# Patient Record
Sex: Female | Born: 1985 | Race: Black or African American | Hispanic: No | Marital: Married | Smoking: Never smoker
Health system: Southern US, Community
[De-identification: ages and names within clinical notes are randomized; demographics above are authoritative.]

## PROBLEM LIST (undated history)

## (undated) DIAGNOSIS — R002 Palpitations: Secondary | ICD-10-CM

## (undated) DIAGNOSIS — T7840XA Allergy, unspecified, initial encounter: Secondary | ICD-10-CM

## (undated) HISTORY — DX: Allergy, unspecified, initial encounter: T78.40XA

## (undated) HISTORY — PX: NO PAST SURGERIES: SHX2092

---

## 2010-08-13 ENCOUNTER — Other Ambulatory Visit (HOSPITAL_COMMUNITY): Payer: Self-pay | Admitting: Obstetrics and Gynecology

## 2010-08-13 DIAGNOSIS — Z0489 Encounter for examination and observation for other specified reasons: Secondary | ICD-10-CM

## 2010-08-13 DIAGNOSIS — R772 Abnormality of alphafetoprotein: Secondary | ICD-10-CM

## 2010-08-20 ENCOUNTER — Ambulatory Visit (HOSPITAL_COMMUNITY)
Admission: RE | Admit: 2010-08-20 | Discharge: 2010-08-20 | Disposition: A | Discharge status: Home / Self Care | Payer: Medicaid Other | Source: Ambulatory Visit | Attending: Obstetrics and Gynecology | Admitting: Obstetrics and Gynecology

## 2010-08-20 DIAGNOSIS — O358XX0 Maternal care for other (suspected) fetal abnormality and damage, not applicable or unspecified: Secondary | ICD-10-CM | POA: Insufficient documentation

## 2010-08-20 DIAGNOSIS — R772 Abnormality of alphafetoprotein: Secondary | ICD-10-CM

## 2010-08-20 DIAGNOSIS — Z1389 Encounter for screening for other disorder: Secondary | ICD-10-CM | POA: Insufficient documentation

## 2010-08-20 DIAGNOSIS — Z363 Encounter for antenatal screening for malformations: Secondary | ICD-10-CM | POA: Insufficient documentation

## 2010-08-20 DIAGNOSIS — Z0489 Encounter for examination and observation for other specified reasons: Secondary | ICD-10-CM

## 2010-08-20 DIAGNOSIS — O352XX0 Maternal care for (suspected) hereditary disease in fetus, not applicable or unspecified: Secondary | ICD-10-CM | POA: Insufficient documentation

## 2010-10-26 ENCOUNTER — Other Ambulatory Visit (HOSPITAL_COMMUNITY): Payer: Self-pay | Admitting: Obstetrics and Gynecology

## 2010-11-02 ENCOUNTER — Ambulatory Visit (HOSPITAL_COMMUNITY)
Admission: RE | Admit: 2010-11-02 | Discharge: 2010-11-02 | Disposition: A | Discharge status: Home / Self Care | Payer: Medicaid Other | Source: Ambulatory Visit | Attending: Obstetrics and Gynecology | Admitting: Obstetrics and Gynecology

## 2010-11-02 ENCOUNTER — Other Ambulatory Visit (HOSPITAL_COMMUNITY): Payer: Self-pay | Admitting: Obstetrics and Gynecology

## 2010-11-02 DIAGNOSIS — O365990 Maternal care for other known or suspected poor fetal growth, unspecified trimester, not applicable or unspecified: Secondary | ICD-10-CM | POA: Insufficient documentation

## 2010-11-02 DIAGNOSIS — O352XX0 Maternal care for (suspected) hereditary disease in fetus, not applicable or unspecified: Secondary | ICD-10-CM | POA: Insufficient documentation

## 2010-11-02 DIAGNOSIS — Z3689 Encounter for other specified antenatal screening: Secondary | ICD-10-CM | POA: Insufficient documentation

## 2010-11-03 ENCOUNTER — Other Ambulatory Visit (HOSPITAL_COMMUNITY): Payer: Self-pay | Admitting: Obstetrics and Gynecology

## 2010-11-03 DIAGNOSIS — R772 Abnormality of alphafetoprotein: Secondary | ICD-10-CM

## 2010-11-04 ENCOUNTER — Other Ambulatory Visit (HOSPITAL_COMMUNITY): Payer: Self-pay | Admitting: Obstetrics and Gynecology

## 2010-11-04 DIAGNOSIS — R772 Abnormality of alphafetoprotein: Secondary | ICD-10-CM

## 2010-11-09 ENCOUNTER — Other Ambulatory Visit (HOSPITAL_COMMUNITY): Payer: Medicaid Other

## 2010-11-11 ENCOUNTER — Ambulatory Visit (HOSPITAL_COMMUNITY)
Admission: RE | Admit: 2010-11-11 | Discharge: 2010-11-11 | Disposition: A | Discharge status: Home / Self Care | Payer: Medicaid Other | Source: Ambulatory Visit | Attending: Obstetrics and Gynecology | Admitting: Obstetrics and Gynecology

## 2010-11-11 DIAGNOSIS — R772 Abnormality of alphafetoprotein: Secondary | ICD-10-CM

## 2010-11-11 DIAGNOSIS — Z3689 Encounter for other specified antenatal screening: Secondary | ICD-10-CM | POA: Insufficient documentation

## 2010-11-11 DIAGNOSIS — O365990 Maternal care for other known or suspected poor fetal growth, unspecified trimester, not applicable or unspecified: Secondary | ICD-10-CM | POA: Insufficient documentation

## 2010-11-11 DIAGNOSIS — O352XX0 Maternal care for (suspected) hereditary disease in fetus, not applicable or unspecified: Secondary | ICD-10-CM | POA: Insufficient documentation

## 2010-11-16 ENCOUNTER — Other Ambulatory Visit (HOSPITAL_COMMUNITY): Payer: Medicaid Other

## 2010-11-18 ENCOUNTER — Other Ambulatory Visit (HOSPITAL_COMMUNITY): Payer: Self-pay | Admitting: Obstetrics and Gynecology

## 2010-11-18 ENCOUNTER — Encounter (HOSPITAL_COMMUNITY): Payer: Self-pay

## 2010-11-18 ENCOUNTER — Ambulatory Visit (HOSPITAL_COMMUNITY)
Admission: RE | Admit: 2010-11-18 | Discharge: 2010-11-18 | Disposition: A | Discharge status: Home / Self Care | Payer: Medicaid Other | Source: Ambulatory Visit | Attending: Obstetrics and Gynecology | Admitting: Obstetrics and Gynecology

## 2010-11-18 DIAGNOSIS — R772 Abnormality of alphafetoprotein: Secondary | ICD-10-CM

## 2010-11-18 DIAGNOSIS — O352XX0 Maternal care for (suspected) hereditary disease in fetus, not applicable or unspecified: Secondary | ICD-10-CM | POA: Insufficient documentation

## 2010-11-18 DIAGNOSIS — Z3689 Encounter for other specified antenatal screening: Secondary | ICD-10-CM | POA: Insufficient documentation

## 2010-11-18 DIAGNOSIS — O365990 Maternal care for other known or suspected poor fetal growth, unspecified trimester, not applicable or unspecified: Secondary | ICD-10-CM | POA: Insufficient documentation

## 2010-11-25 ENCOUNTER — Ambulatory Visit (HOSPITAL_COMMUNITY): Payer: Medicaid Other

## 2010-11-29 ENCOUNTER — Inpatient Hospital Stay (HOSPITAL_COMMUNITY)
Admission: AD | Admit: 2010-11-29 | Discharge: 2010-12-02 | DRG: 775 | Disposition: A | Discharge status: Home / Self Care | Payer: Medicaid Other | Source: Ambulatory Visit | Attending: Obstetrics and Gynecology | Admitting: Obstetrics and Gynecology

## 2010-11-29 LAB — CBC
Hemoglobin: 11.9 g/dL — ABNORMAL LOW (ref 12.0–15.0)
RBC: 3.93 MIL/uL (ref 3.87–5.11)

## 2010-11-30 ENCOUNTER — Other Ambulatory Visit: Payer: Self-pay | Admitting: Obstetrics and Gynecology

## 2010-11-30 LAB — RPR: RPR Ser Ql: NONREACTIVE

## 2010-12-02 ENCOUNTER — Ambulatory Visit (HOSPITAL_COMMUNITY): Admission: RE | Admit: 2010-12-02 | Payer: Medicaid Other | Source: Ambulatory Visit

## 2010-12-02 ENCOUNTER — Ambulatory Visit (HOSPITAL_COMMUNITY): Payer: Medicaid Other

## 2010-12-05 NOTE — Discharge Summary (Signed)
  NAMETARHONDA, HOLLENBERG                ACCOUNT NO.:  [id number]  MEDICAL RECORD NO.:  [id number]  LOCATION:  9111                          FACILITY:  [facility]  PHYSICIAN:  Zenaida Niece, M.D.DATE OF BIRTH:  06-11-1985  DATE OF ADMISSION:  11/29/2010 DATE OF DISCHARGE:  12/02/2010                              DISCHARGE SUMMARY   ADMISSION DIAGNOSIS:  Intrauterine pregnancy at 37 weeks with rupture of membranes.  DISCHARGE DIAGNOSIS:  Intrauterine pregnancy at 37 weeks with rupture of membranes.  PROCEDURES:  On November 30, 2010, she had a spontaneous vaginal delivery.  HISTORY AND PHYSICAL:  This is a 25 year old gravida 1, para 0 with an EGA of 37 plus weeks who presents with complaint of leaking fluid since 1430 on November 29, 2010, with occasional contractions.  Evaluation in triage confirmed ruptured membranes and cervix was 2, 50 and -1 with a vertex presentation per the nurse.  Prenatal care complicated by possible SGA followed with NSTs, biophysical profiles and ultrasounds in consult with maternal fetal medicine.  Prenatal labs blood type is A+ with negative antibody screen, rubella immune, hepatitis B surface antigen negative.  Quad screen with a 1 in 29 risk of trisomy 18, 1-hour Glucola 95, group B strep is negative.  PAST MEDICAL HISTORY:  History of Chlamydia and polydactyly.  PAST SURGICAL HISTORY:  Finger surgery.  PHYSICAL EXAMINATION:  GENERAL:  She is afebrile with stable vital signs.  Fetal heart tracing category II with occasional variable decels and occasional contractions. ABDOMEN:  Gravid, nontender with an estimated fetal weight of 5 pounds. Cervix again is 2, 50, -1 vertex per the nurse.  HOSPITAL COURSE:  The patient was admitted and started on Pitocin augmentation.  She progressed into active labor and received an epidural.  She progressed to complete, pushed well and on the morning of November 30, 2010, had a vaginal delivery of a viable female infant  with Apgars of 9 and 9, weight of 5 pounds 7 ounces.  Placenta delivered spontaneously and was intact.  She had small first-degree laceration repaired with 3-0 Vicryl and several superficial labial abrasions which were hemostatic and not repaired.  Estimated blood loss was less than 500 mL.  Postpartum, she had no significant complications and on postpartum #2, she was felt to be stable enough for discharge home.  DISCHARGE INSTRUCTIONS:  Regular diet, pelvic rest, follow up in 6 weeks.  MEDICATIONS:  Over-the-counter ibuprofen as needed and Percocet #20 one to two p.o. q.6 h p.r.n. pain and she was given our discharge pamphlet.     Zenaida Niece, M.D.     TDM/MEDQ  D:  12/02/2010  T:  12/02/2010  Job:  [id number]  Electronically Signed by Lavina Hamman M.D. on 12/05/2010 09:18:16 AM

## 2010-12-09 ENCOUNTER — Inpatient Hospital Stay (HOSPITAL_COMMUNITY): Admission: AD | Admit: 2010-12-09 | Payer: Self-pay | Source: Ambulatory Visit | Admitting: Obstetrics and Gynecology

## 2012-01-20 ENCOUNTER — Encounter (HOSPITAL_COMMUNITY): Payer: Self-pay | Admitting: *Deleted

## 2012-01-20 ENCOUNTER — Emergency Department (INDEPENDENT_AMBULATORY_CARE_PROVIDER_SITE_OTHER)
Admission: EM | Admit: 2012-01-20 | Discharge: 2012-01-20 | Disposition: A | Discharge status: Home / Self Care | Payer: Self-pay | Source: Home / Self Care

## 2012-01-20 DIAGNOSIS — N750 Cyst of Bartholin's gland: Secondary | ICD-10-CM

## 2012-01-20 LAB — POCT URINALYSIS DIP (DEVICE)
Glucose, UA: NEGATIVE mg/dL
Ketones, ur: 15 mg/dL — AB
Specific Gravity, Urine: >=1.03 (ref 1.005–1.030)

## 2012-01-20 NOTE — ED Notes (Signed)
Pt  Has  A  Tender  Swollen  Area  Inside  Vagina     X  sev  Weeks      She  Reports  Slight   Vaginal  Discharge  denys  Any  Bleeding       Walks  Upright  With a  Slow  Steady   Gait

## 2012-01-20 NOTE — ED Provider Notes (Signed)
History     CSN: [id number]  Arrival date & time 01/20/12  1837   None     Chief Complaint  Patient presents with  . Groin Swelling    (Consider location/radiation/quality/duration/timing/severity/associated sxs/prior treatment) The history is provided by the patient.  Patient complains of left vaginal "bump" for two weeks associated with mild discomfort.  States discomfort is intermittent in nature; No fever, + nonirritating vaginal discharge, -abdominal pain, -vomiting, -diarrhea, -UTI symptoms.  Sexually active, denies hx of STD's, does not use condoms, no change in partners.  Last unprotected intercourse 2 days ago.  Denies history of known exposure to STD or symptoms in partner.  Patient's last menstrual period was 12/08/2011.  Last pap 1 year ago-normal.   History reviewed. No pertinent past medical history.  History reviewed. No pertinent past surgical history.  History reviewed. No pertinent family history.  History  Substance Use Topics  . Smoking status: Never Smoker   . Smokeless tobacco: Not on file  . Alcohol Use:     OB History    Grav Para Term Preterm Abortions TAB SAB Ect Mult Living   1               Review of Systems  Constitutional: Negative.   Respiratory: Negative.   Cardiovascular: Negative.   Gastrointestinal: Negative.   Genitourinary: Positive for vaginal discharge and vaginal pain. Negative for dysuria, urgency, frequency, hematuria, flank pain, difficulty urinating, genital sores, menstrual problem, pelvic pain and dyspareunia.  Skin: Negative.   Hematological: Negative for adenopathy.    Allergies  Review of patient's allergies indicates no known allergies.  Home Medications  No current outpatient prescriptions on file.  BP 105/65  Pulse 60  Temp 98.3 F (36.8 C) (Oral)  Resp 16  SpO2 98%  LMP 12/08/2011  Breastfeeding? Unknown  Physical Exam  Nursing note and vitals reviewed. Constitutional: She is oriented to person,  place, and time. Vital signs are normal. She appears well-developed and well-nourished. She is active and cooperative.  HENT:  Head: Normocephalic.  Eyes: Conjunctivae are normal. Pupils are equal, round, and reactive to light. No scleral icterus.  Neck: Trachea normal. Neck supple.  Cardiovascular: Normal rate, regular rhythm and normal heart sounds.   Pulmonary/Chest: Effort normal and breath sounds normal.  Abdominal: Normal appearance and bowel sounds are normal. There is no tenderness.  Genitourinary: Pelvic exam was performed with patient supine. No labial fusion. There is no rash, tenderness or lesion on the right labia. There is no rash, tenderness or lesion on the left labia. No erythema, tenderness or bleeding around the vagina. No foreign body around the vagina. No signs of injury around the vagina. Vaginal discharge found.       White discharge; bartholin's cyst left at 4 o'clock, approximately 1-2 cm  Lymphadenopathy:       Right: No inguinal adenopathy present.       Left: No inguinal adenopathy present.  Neurological: She is alert and oriented to person, place, and time. No cranial nerve deficit or sensory deficit.  Skin: Skin is warm and dry.  Psychiatric: She has a normal mood and affect. Her speech is normal and behavior is normal. Judgment and thought content normal. Cognition and memory are normal.    ED Course  Procedures (including critical care time)  Labs Reviewed  POCT URINALYSIS DIP (DEVICE) - Abnormal; Notable for the following:    Ketones, ur 15 (*)     All other components within normal limits  No results found.   1. Bartholin gland cyst       MDM  No abscess appreciated.  Sitz baths twice daily, ibuprofen for discomfort, monitor for changes as discussed.          Johnsie Kindred, NP 01/20/12 2208

## 2012-01-21 NOTE — ED Provider Notes (Signed)
Medical screening examination/treatment/procedure(s) were performed by non-physician practitioner and as supervising physician I was immediately available for consultation/collaboration.   [facility]; MD   Sharin Grave, MD 01/21/12 [phone]

## 2012-02-15 ENCOUNTER — Telehealth (HOSPITAL_COMMUNITY): Payer: Self-pay | Admitting: *Deleted

## 2012-02-15 NOTE — ED Notes (Signed)
Pt. called on VM and said she was seen for a cyst 3 weeks ago.  States it is still there and wants to know how to make it go away.  I called pt. back and she said it is in her vagina, it hurts, is draining with an odor. I asked if she got any antibiotics. She said no. I told her to come back and be rechecked.  Vassie Moselle 02/15/2012

## 2012-10-18 IMAGING — US US OB DETAIL+14 WK
1 series · 14 of 28 positions shown · non-contrast
Comparison: none

[Series 1: us ob detail+14 wk · 0.21mm/px · 14 of 86 slices shown]
[im 4/86]
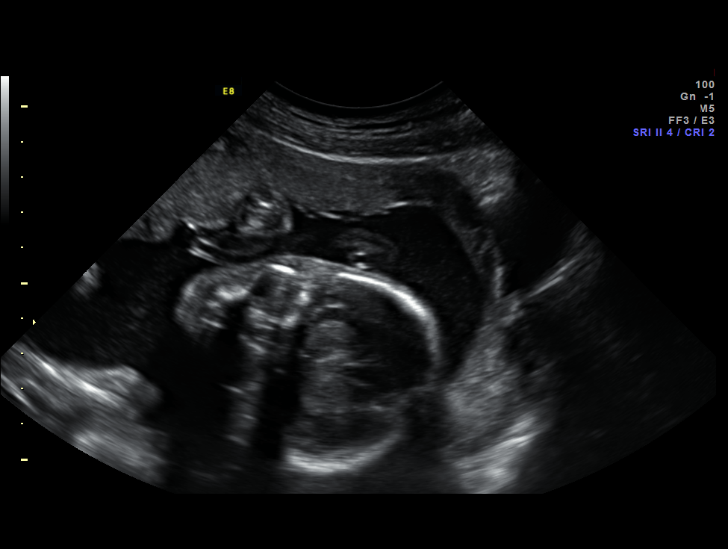
[im 10/86]
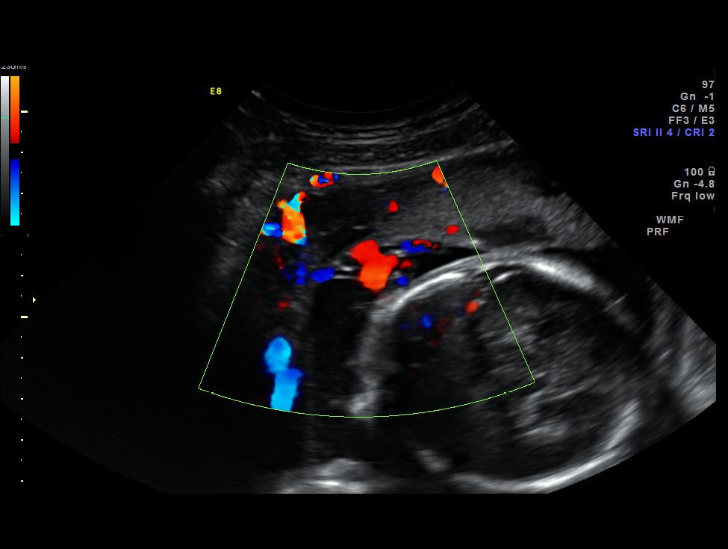
[im 16/86]
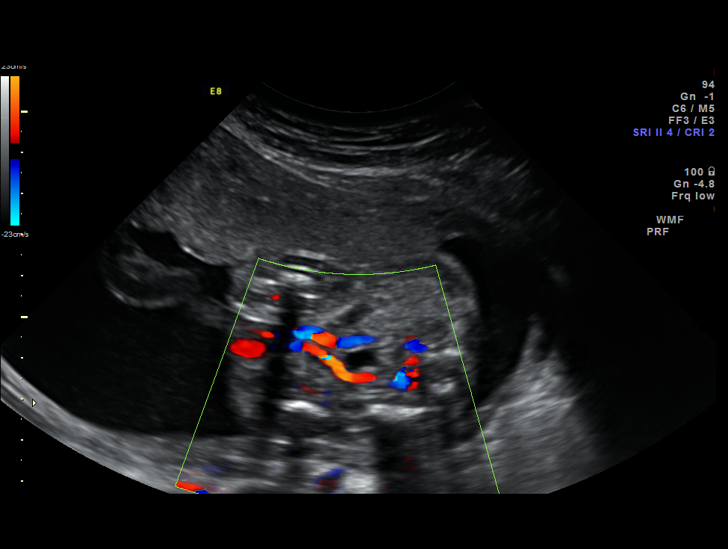
[im 23/86]
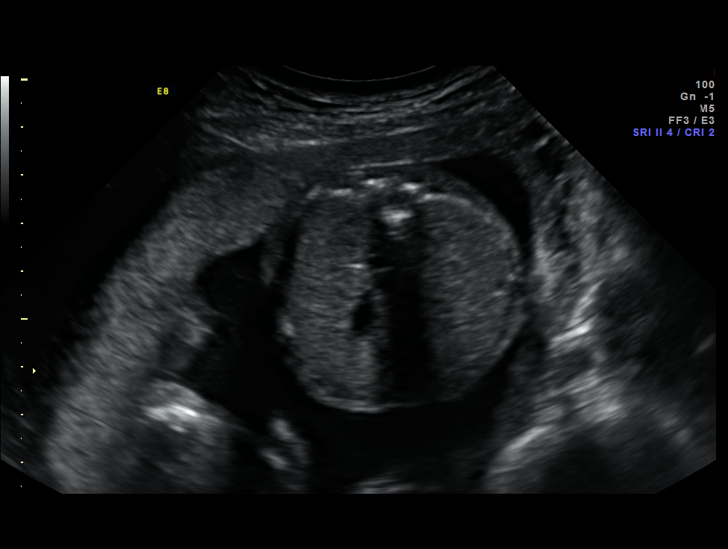
[im 29/86]
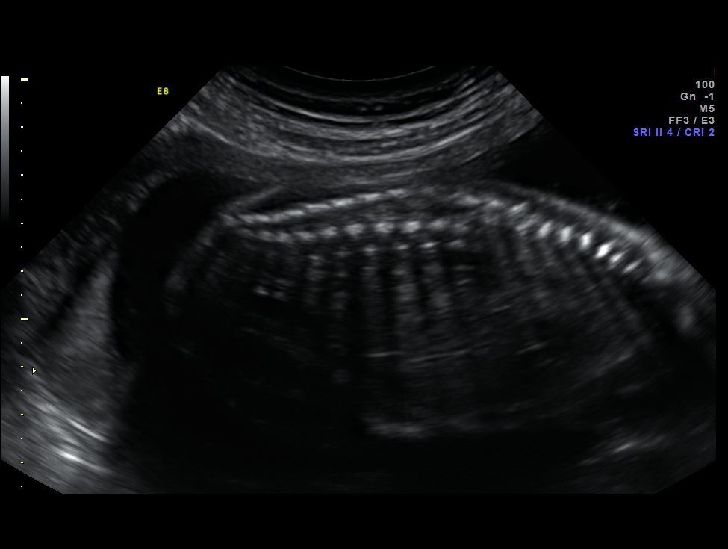
[im 35/86]
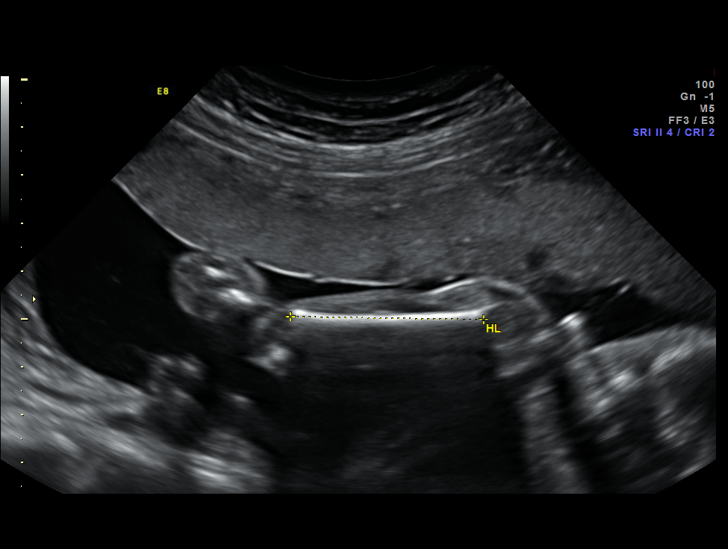
[im 41/86]
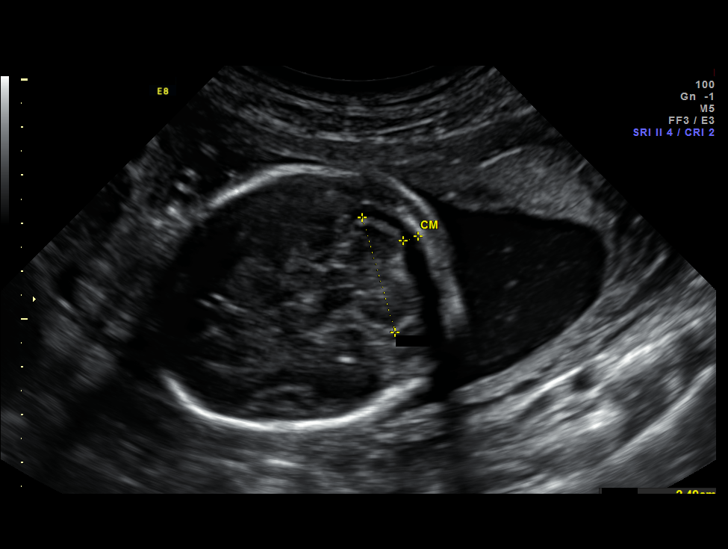
[im 48/86]
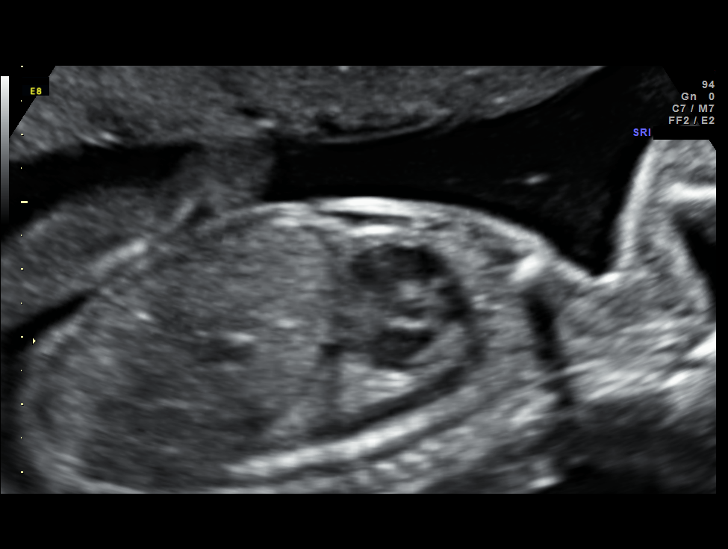
[im 54/86]
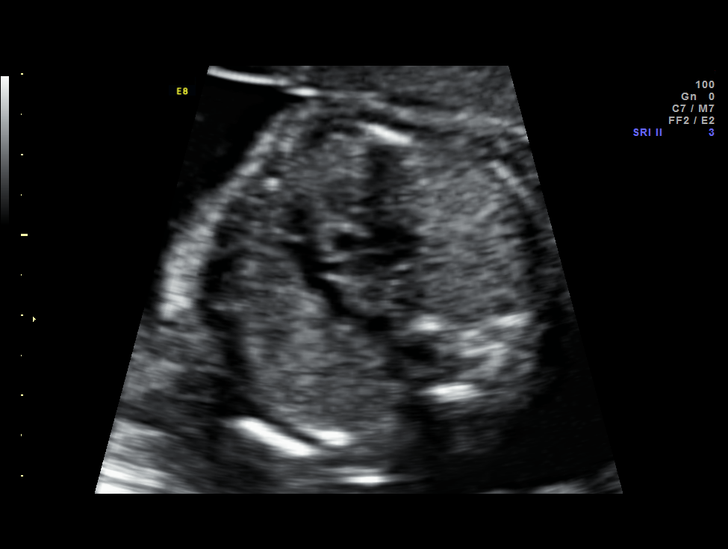
[im 60/86]
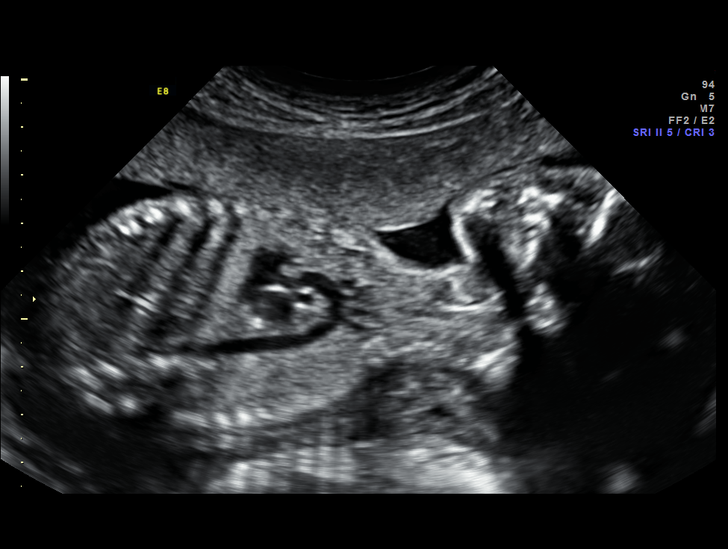
[im 67/86]
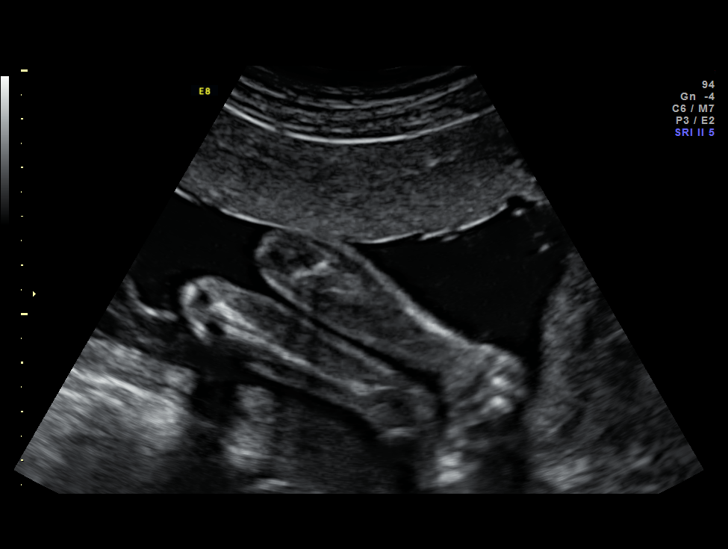
[im 73/86]
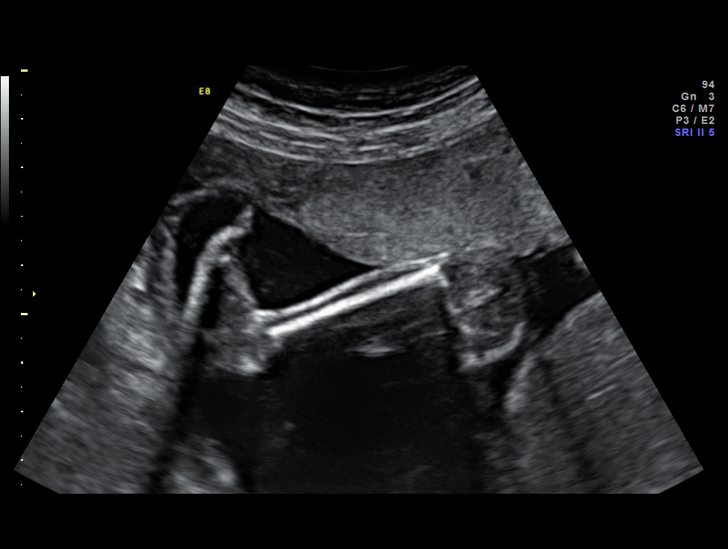
[im 79/86]
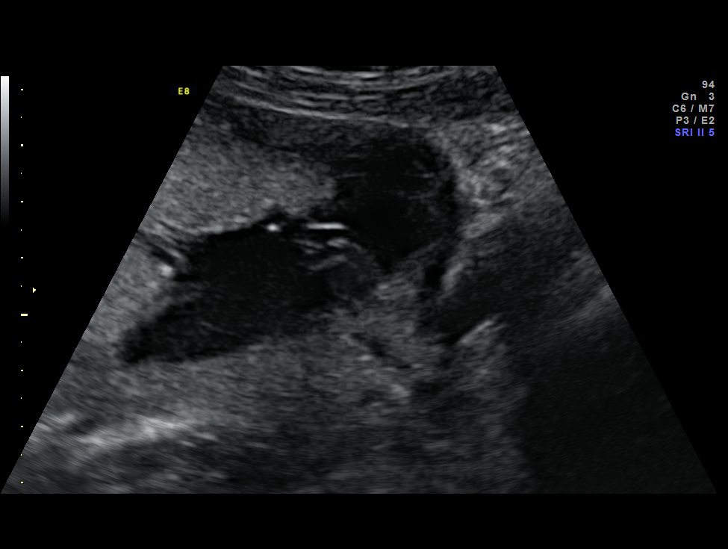
[im 86/86]
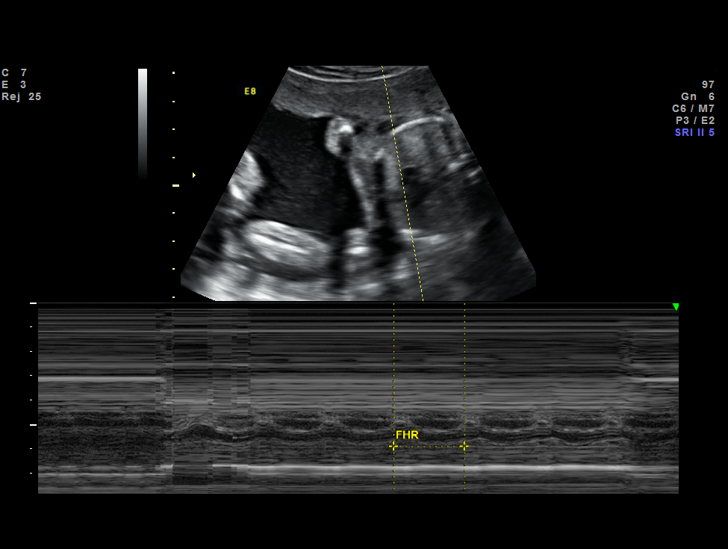

[14 of 28 positions shown; findings below may reference images not displayed]

Canned report from images found in remote index.

Refer to host system for actual result text.

## 2013-01-09 IMAGING — US US FETAL BPP W/O NONSTRESS
1 series · 14 of 21 positions shown · non-contrast
Comparison: none

[Series 1: us fetal bpp w/o nonstress · 0.24mm/px · 14 of 21 slices shown]
[im 1/21]
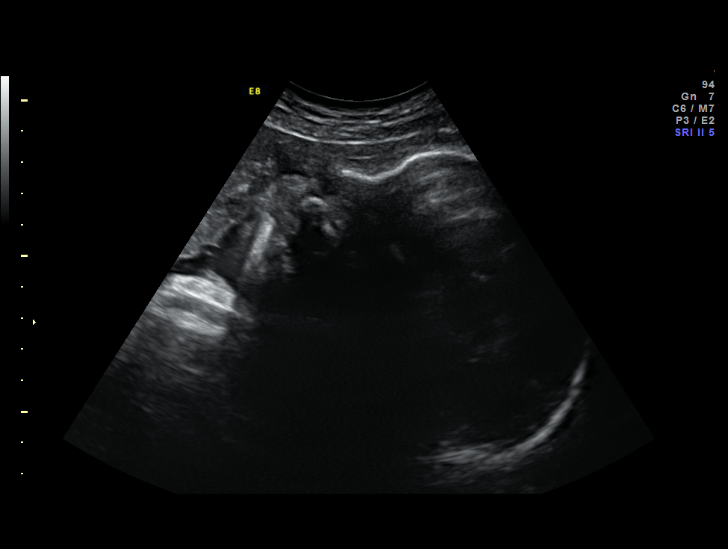
[im 3/21]
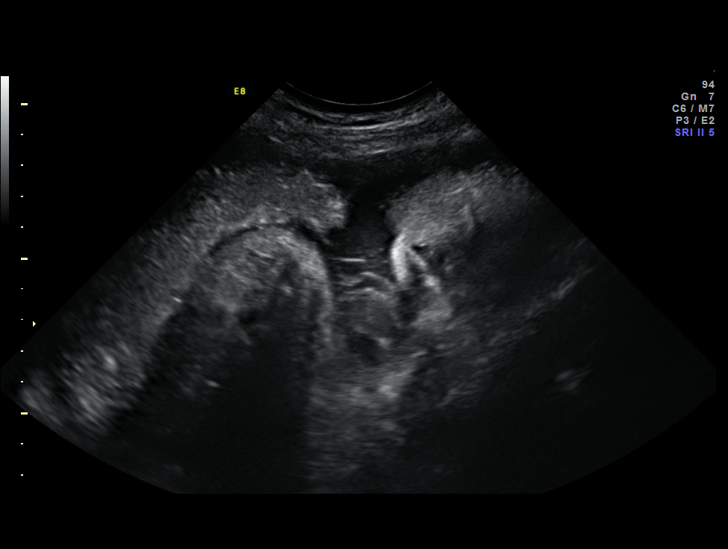
[im 4/21]
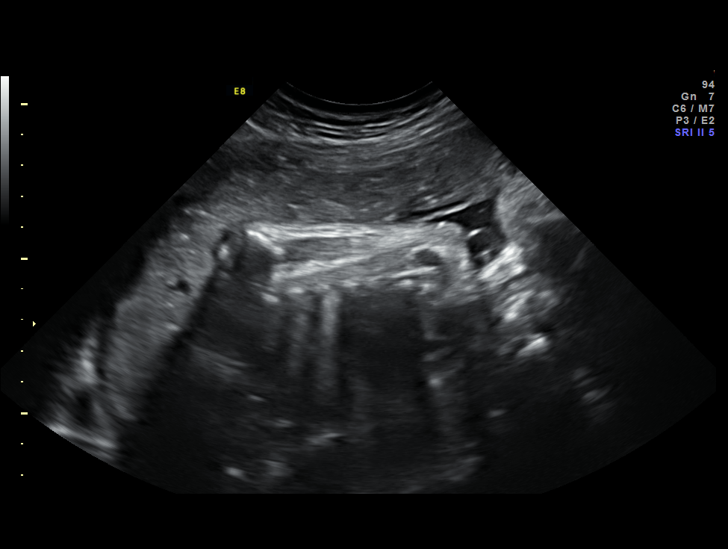
[im 6/21]
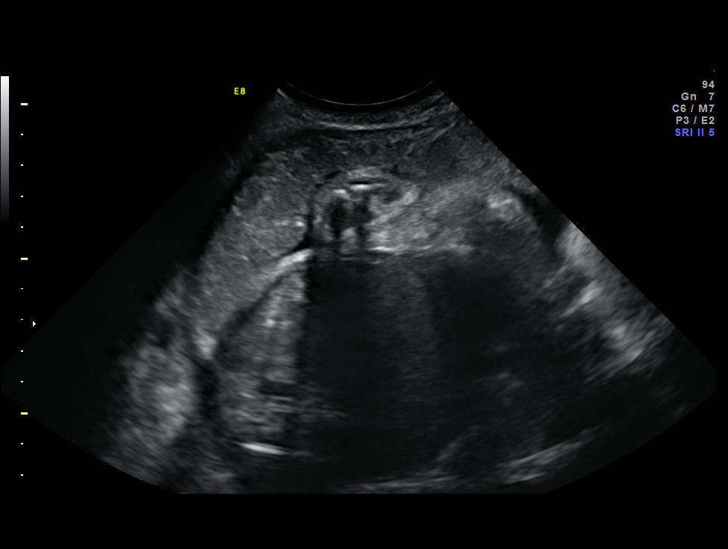
[im 7/21]
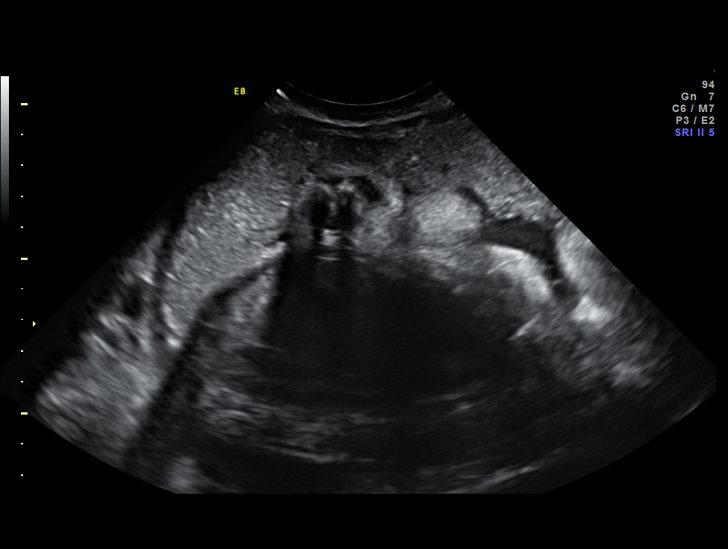
[im 9/21]
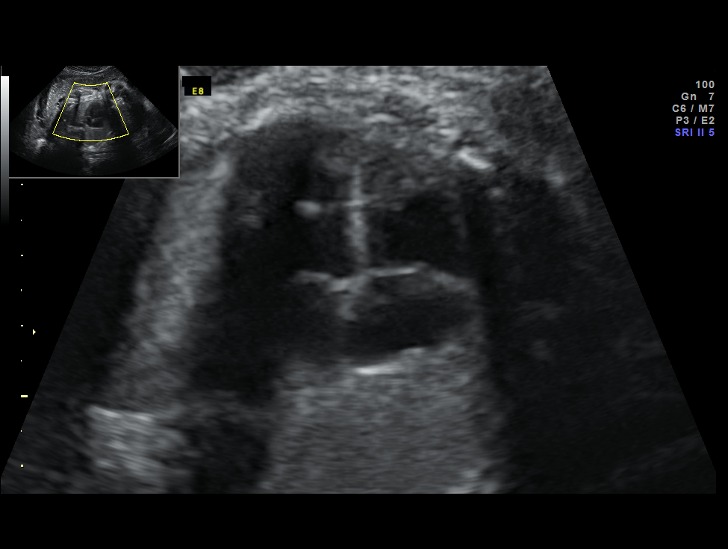
[im 10/21]
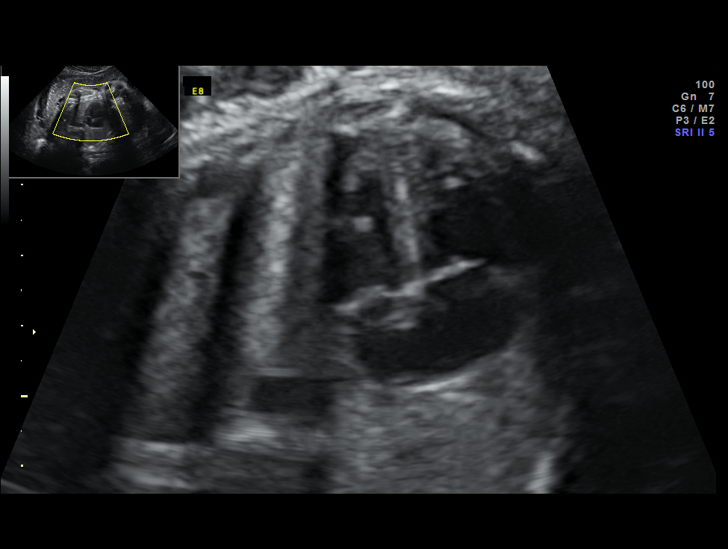
[im 12/21]
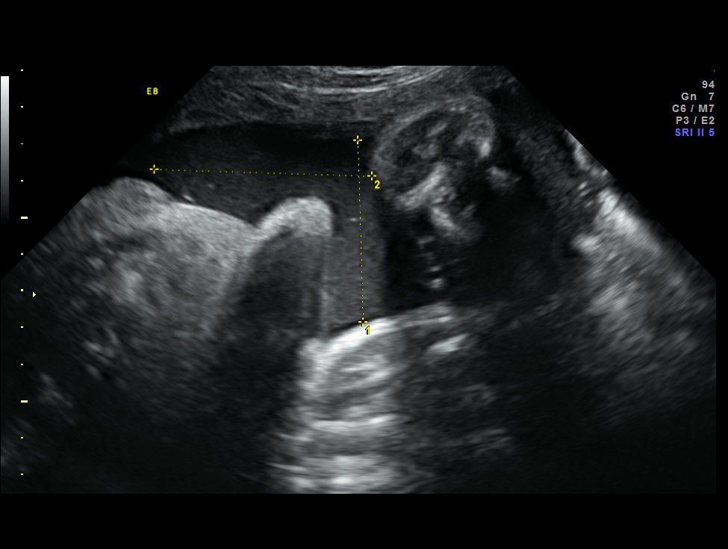
[im 13/21]
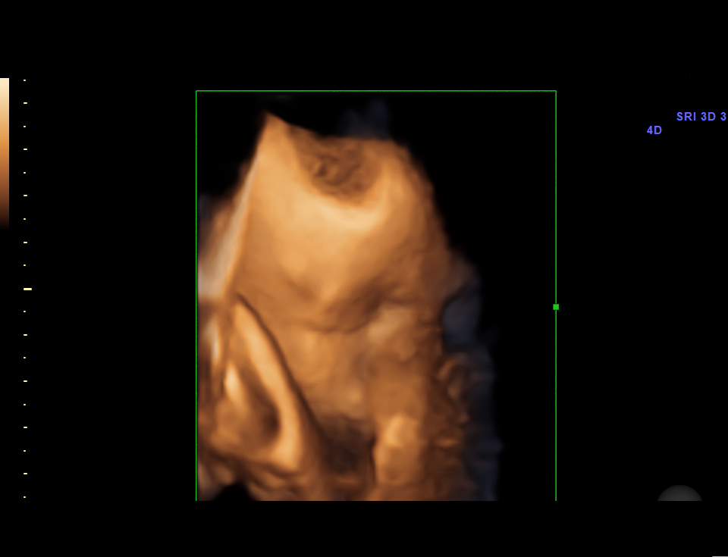
[im 15/21]
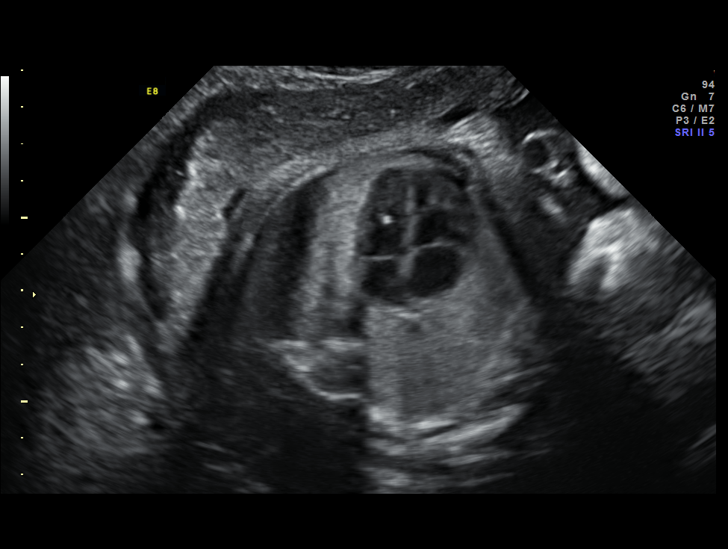
[im 16/21]
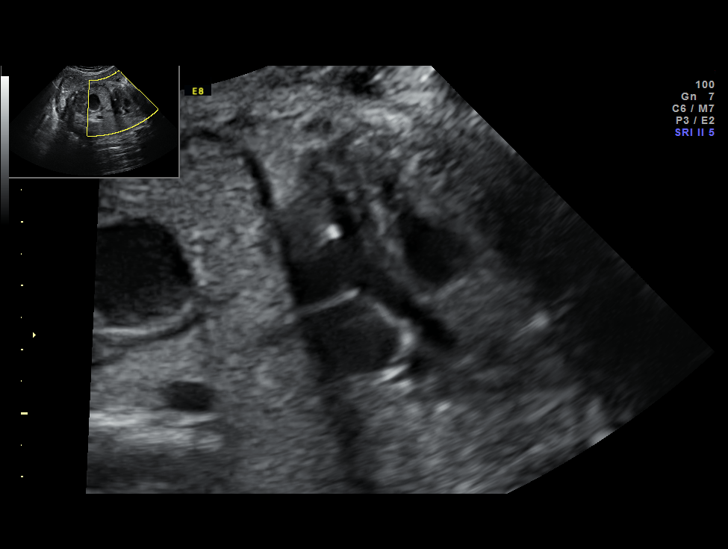
[im 18/21]
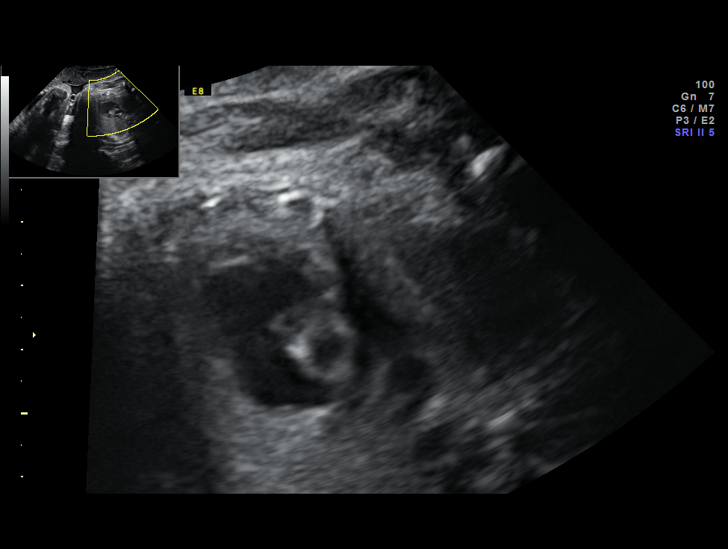
[im 19/21]
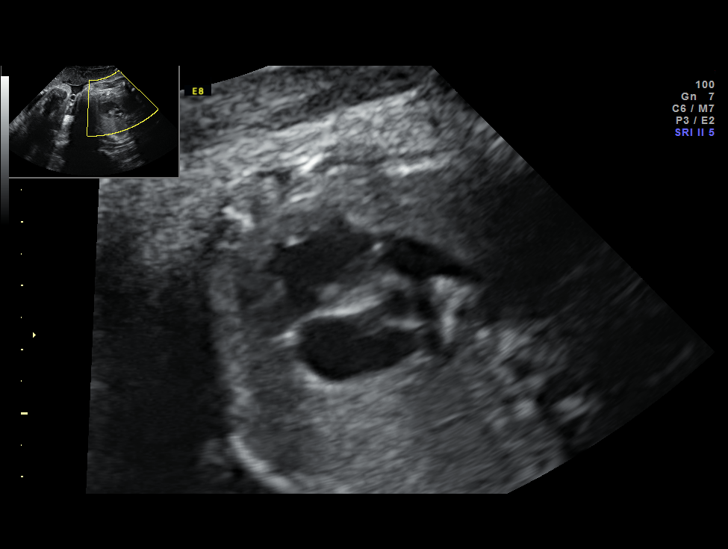
[im 21/21]
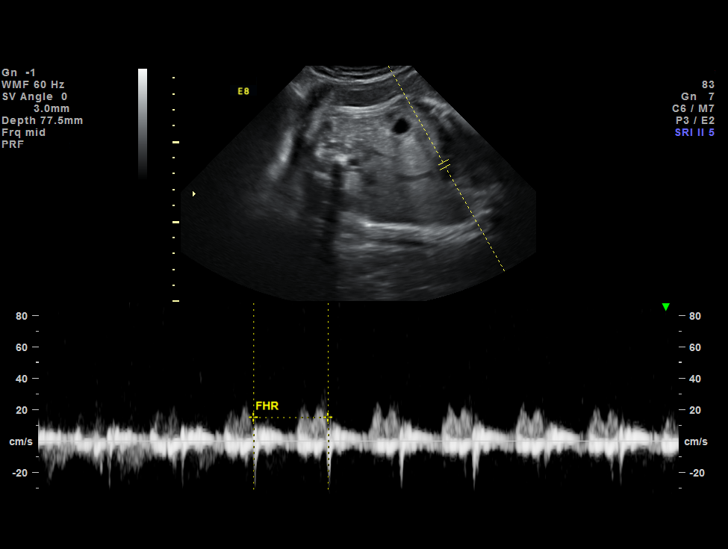

[14 of 21 positions shown; findings below may reference images not displayed]

Canned report from images found in remote index.

Refer to host system for actual result text.

## 2013-01-15 ENCOUNTER — Encounter (HOSPITAL_COMMUNITY): Payer: Self-pay | Admitting: Emergency Medicine

## 2013-01-15 ENCOUNTER — Emergency Department (HOSPITAL_COMMUNITY)
Admission: EM | Admit: 2013-01-15 | Discharge: 2013-01-15 | Disposition: A | Discharge status: Home / Self Care | Payer: BC Managed Care – PPO | Attending: Emergency Medicine | Admitting: Emergency Medicine

## 2013-01-15 DIAGNOSIS — N750 Cyst of Bartholin's gland: Secondary | ICD-10-CM | POA: Insufficient documentation

## 2013-01-15 DIAGNOSIS — A499 Bacterial infection, unspecified: Secondary | ICD-10-CM | POA: Insufficient documentation

## 2013-01-15 DIAGNOSIS — Z3202 Encounter for pregnancy test, result negative: Secondary | ICD-10-CM | POA: Insufficient documentation

## 2013-01-15 DIAGNOSIS — N760 Acute vaginitis: Secondary | ICD-10-CM | POA: Insufficient documentation

## 2013-01-15 DIAGNOSIS — B9689 Other specified bacterial agents as the cause of diseases classified elsewhere: Secondary | ICD-10-CM | POA: Insufficient documentation

## 2013-01-15 LAB — POCT I-STAT, CHEM 8
Calcium, Ion: 1.21 mmol/L (ref 1.12–1.23)
Chloride: 105 mEq/L (ref 96–112)
HCT: 42 % (ref 36.0–46.0)
Potassium: 3.7 mEq/L (ref 3.5–5.1)

## 2013-01-15 LAB — WET PREP, GENITAL

## 2013-01-15 LAB — URINALYSIS, ROUTINE W REFLEX MICROSCOPIC
Glucose, UA: NEGATIVE mg/dL
Protein, ur: NEGATIVE mg/dL
pH: 7 (ref 5.0–8.0)

## 2013-01-15 LAB — POCT PREGNANCY, URINE: Preg Test, Ur: NEGATIVE

## 2013-01-15 LAB — URINE MICROSCOPIC-ADD ON

## 2013-01-15 MED ORDER — DOXYCYCLINE HYCLATE 100 MG PO CAPS: 100.0000 mg | ORAL_CAPSULE | Freq: Two times a day (BID) | ORAL | Status: DC

## 2013-01-15 MED ORDER — METRONIDAZOLE 500 MG PO TABS: 500.0000 mg | ORAL_TABLET | Freq: Two times a day (BID) | ORAL | Status: DC

## 2013-01-15 NOTE — ED Notes (Signed)
Had abscess in vaginal wall back in Dec, went away and came back one week ago. sts having cottage cheese like discharge. Denies problems with urinating.

## 2013-01-15 NOTE — ED Provider Notes (Addendum)
CSN: [id number]     Arrival date & time 01/15/13  1816 History     First MD Initiated Contact with Patient 01/15/13 2016     Chief Complaint  Patient presents with  . Abscess  . Vaginal Discharge   HPI  History provided by the patient. The patient is a 27 year old female who presents with concern for vaginal discharge and continued swelling around the labia. Patient states that over a year ago she was diagnosed with an abscess or cyst the vaginal area. At that time she had significant swelling and pain. She was advised to use warm soaks and her symptoms improved. Since then she occasionally has some mild swelling and fullness to the same area. Currently she denies any pain however she has had significant thick white "cottage cheese" like discharge with foul odor for the past week. Patient didn't attempt to use an over-the-counter yeast medication but symptoms did not improve have only worsened. She denies any associated lower pelvic or abdominal pain. No fever, chills or sweats. No dysuria, hematuria urinary frequency. No menstrual changes. No other aggravating or alleviating factors. No other associated symptoms.     History reviewed. No pertinent past medical history. History reviewed. No pertinent past surgical history. No family history on file. History  Substance Use Topics  . Smoking status: Never Smoker   . Smokeless tobacco: Not on file  . Alcohol Use: Yes     Comment: sometime   OB History   Grav Para Term Preterm Abortions TAB SAB Ect Mult Living   1              Review of Systems  Constitutional: Negative for fever, chills and diaphoresis.  Gastrointestinal: Negative for abdominal pain.  Genitourinary: Positive for vaginal discharge. Negative for dysuria, frequency, hematuria, flank pain, vaginal bleeding, genital sores and pelvic pain.  All other systems reviewed and are negative.    Allergies  Review of patient's allergies indicates no known allergies.  Home  Medications   Current Outpatient Rx  Name  Route  Sig  Dispense  Refill  . Naproxen Sodium (ALEVE PO)   Oral   Take 2 tablets by mouth daily as needed (headache).          BP 124/77  Pulse 72  Temp(Src) 98.3 F (36.8 C) (Oral)  Resp 16  Ht 5\' 5"  (1.651 m)  Wt 105 lb (47.628 kg)  BMI 17.47 kg/m2  SpO2 100%  LMP 12/19/2012 Physical Exam  Nursing note and vitals reviewed. Constitutional: She is oriented to person, place, and time. She appears well-developed and well-nourished. No distress.  HENT:  Head: Normocephalic.  Cardiovascular: Normal rate and regular rhythm.   Pulmonary/Chest: Effort normal and breath sounds normal. No respiratory distress. She has no wheezes.  Abdominal: Soft. She exhibits no distension. There is no tenderness. There is no rebound and no guarding.  No CVA tenderness  Genitourinary:  Chaperone was present. There is a thick white vaginal discharge. No adnexal tenderness or CMT. There is a slight swelling and prominence to the left Bartholin area. There is not any significant tenderness. No other rashes or lesions.  Musculoskeletal: Normal range of motion.  Neurological: She is alert and oriented to person, place, and time.  Skin: Skin is warm and dry. No rash noted.  Psychiatric: She has a normal mood and affect. Her behavior is normal.    ED Course   Procedures   Results for orders placed during the hospital encounter of 01/15/13  WET PREP, GENITAL      Result Value Range   Yeast Wet Prep HPF POC NONE SEEN  NONE SEEN   Trich, Wet Prep NONE SEEN  NONE SEEN   Clue Cells Wet Prep HPF POC MODERATE (*) NONE SEEN   WBC, Wet Prep HPF POC TOO NUMEROUS TO COUNT (*) NONE SEEN  URINALYSIS, ROUTINE W REFLEX MICROSCOPIC      Result Value Range   Color, Urine YELLOW  YELLOW   APPearance HAZY (*) CLEAR   Specific Gravity, Urine 1.026  1.005 - 1.030   pH 7.0  5.0 - 8.0   Glucose, UA NEGATIVE  NEGATIVE mg/dL   Hgb urine dipstick NEGATIVE  NEGATIVE    Bilirubin Urine NEGATIVE  NEGATIVE   Ketones, ur NEGATIVE  NEGATIVE mg/dL   Protein, ur NEGATIVE  NEGATIVE mg/dL   Urobilinogen, UA 1.0  0.0 - 1.0 mg/dL   Nitrite NEGATIVE  NEGATIVE   Leukocytes, UA SMALL (*) NEGATIVE  URINE MICROSCOPIC-ADD ON      Result Value Range   Squamous Epithelial / LPF MANY (*) RARE   WBC, UA 3-6  <3 WBC/hpf   Bacteria, UA FEW (*) RARE   Urine-Other MUCOUS PRESENT    POCT PREGNANCY, URINE      Result Value Range   Preg Test, Ur NEGATIVE  NEGATIVE  POCT I-STAT, CHEM 8      Result Value Range   Sodium 139  135 - 145 mEq/L   Potassium 3.7  3.5 - 5.1 mEq/L   Chloride 105  96 - 112 mEq/L   BUN 11  6 - 23 mg/dL   Creatinine, Ser 1.61  0.50 - 1.10 mg/dL   Glucose, Bld 89  70 - 99 mg/dL   Calcium, Ion 0.96  0.45 - 1.23 mmol/L   TCO2 24  0 - 100 mmol/L   Hemoglobin 14.3  12.0 - 15.0 g/dL   HCT 40.9  81.1 - 91.4 %       1. Bacterial vaginosis   2. Bartholin cyst     MDM  Patient seen and evaluated. Patient appears well no acute distress.  No I&D was performed.  No indications at this time.  Pt referred to follow up with OB/GYN   Angus Seller, PA-C 01/15/13 2227  Angus Seller, PA-C 01/30/13 2009  Angus Seller, [place] 02/01/13 [phone]

## 2013-01-15 NOTE — ED Notes (Signed)
Pt to ED for evaluation of vaginal discharge for the past 2 weeks, pt noticed an abscess to the inside of her vagina- pt had an abscess there a year ago and was told to soak in hot water which made the abscess go down in size.  Pt has tried that this time without relief.  Vaginal discharge tan in color, pt thought it was a yeast infection and used Monistat without relief.

## 2013-01-16 LAB — GC/CHLAMYDIA PROBE AMP
CT Probe RNA: NEGATIVE
GC Probe RNA: NEGATIVE

## 2013-01-16 IMAGING — US US OB FOLLOW-UP
1 series · 14 of 28 positions shown · non-contrast
Comparison: none

[Series 1: us ob follow-up · 0.23mm/px · 14 of 51 slices shown]
[im 2/51]
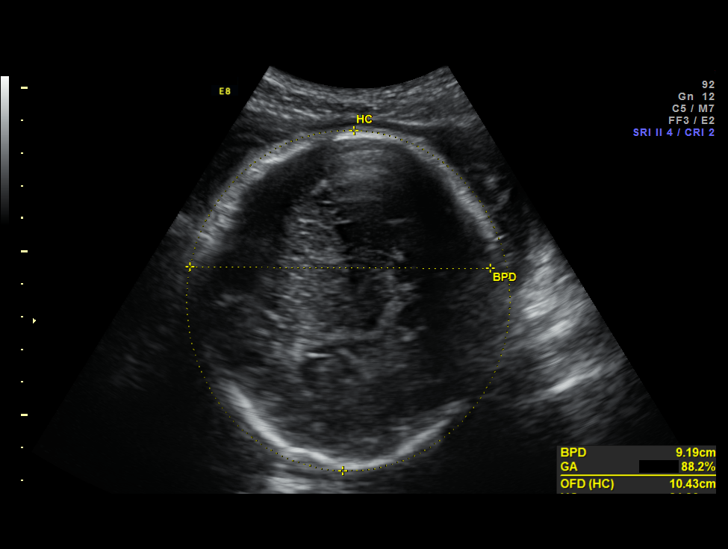
[im 6/51]
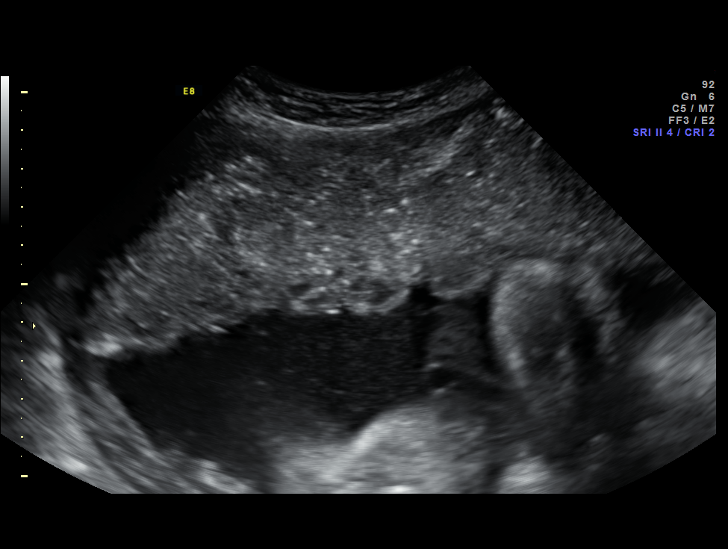
[im 10/51]
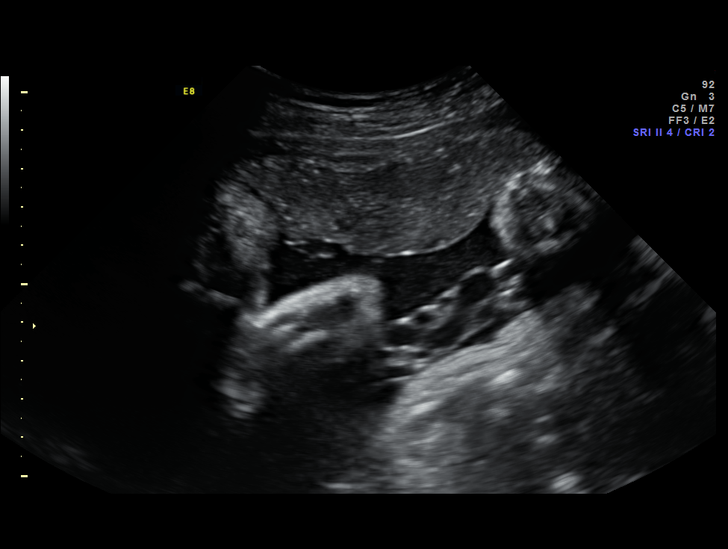
[im 13/51]
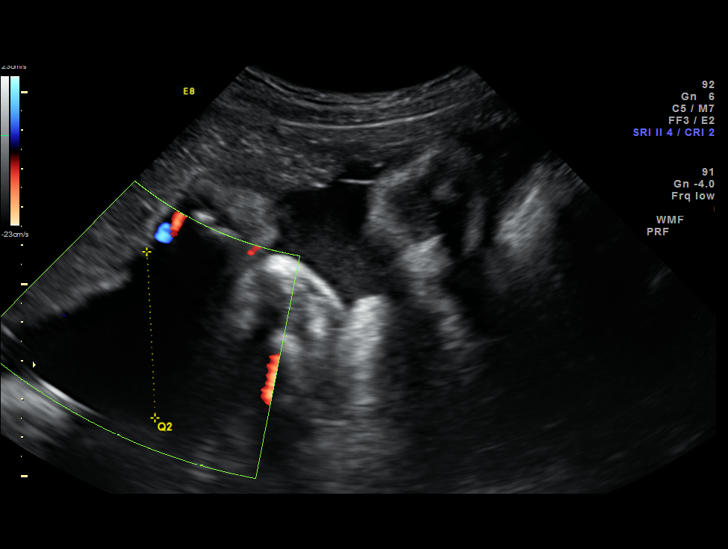
[im 17/51]
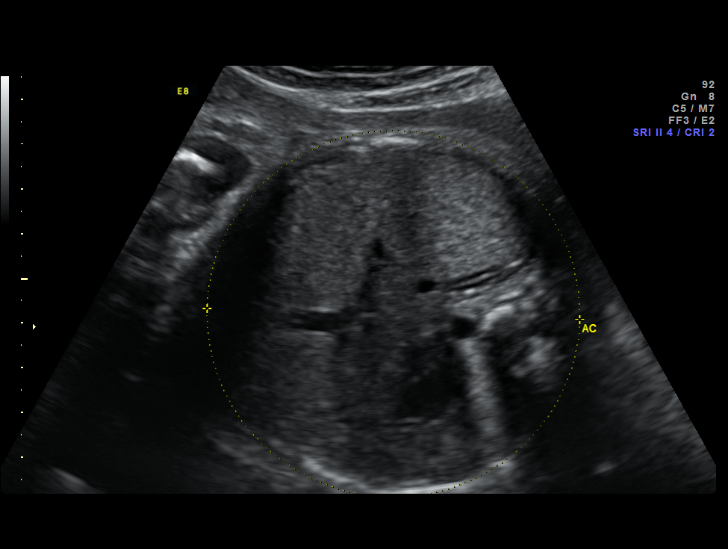
[im 21/51]
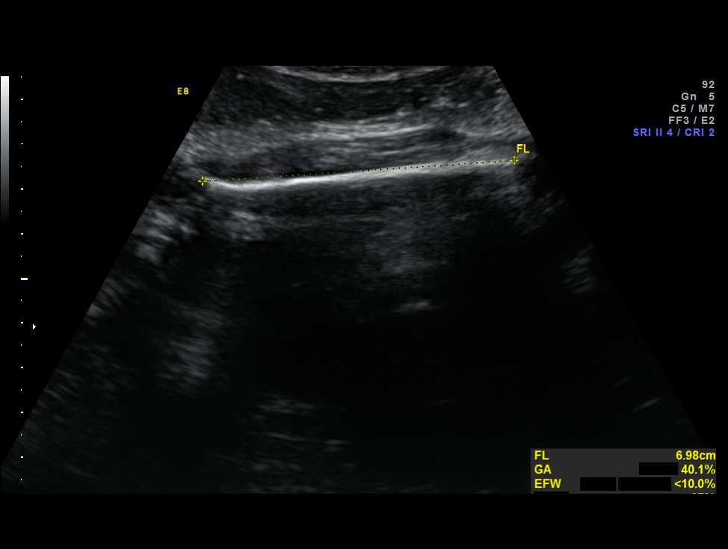
[im 25/51]
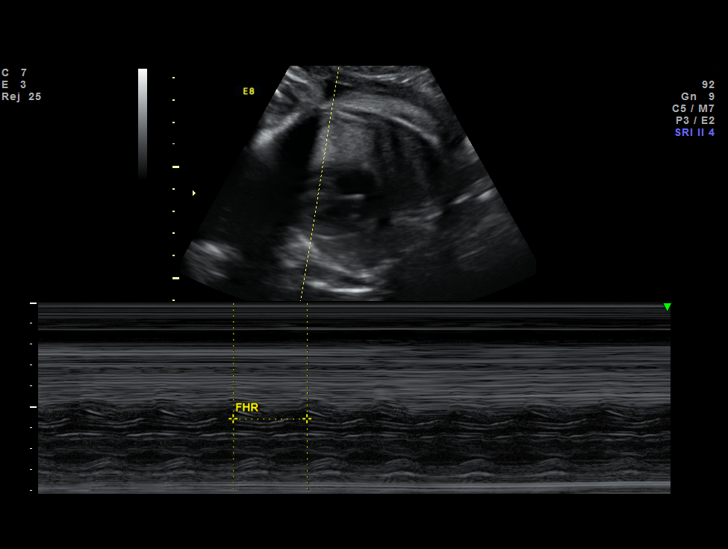
[im 28/51]
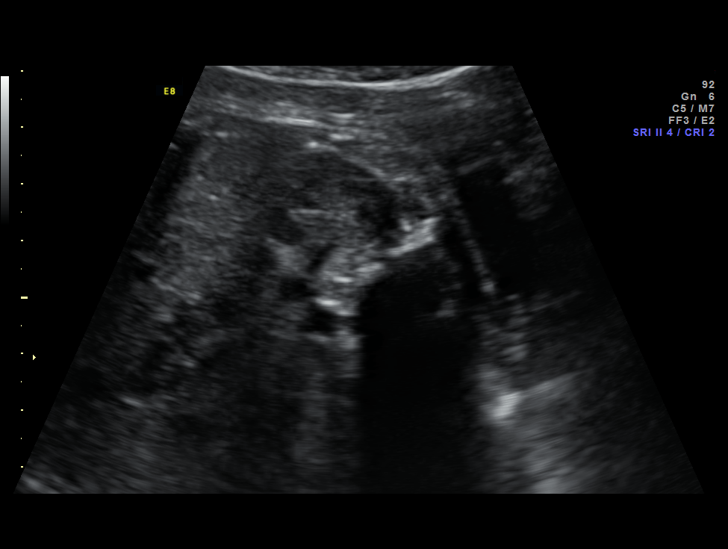
[im 32/51]
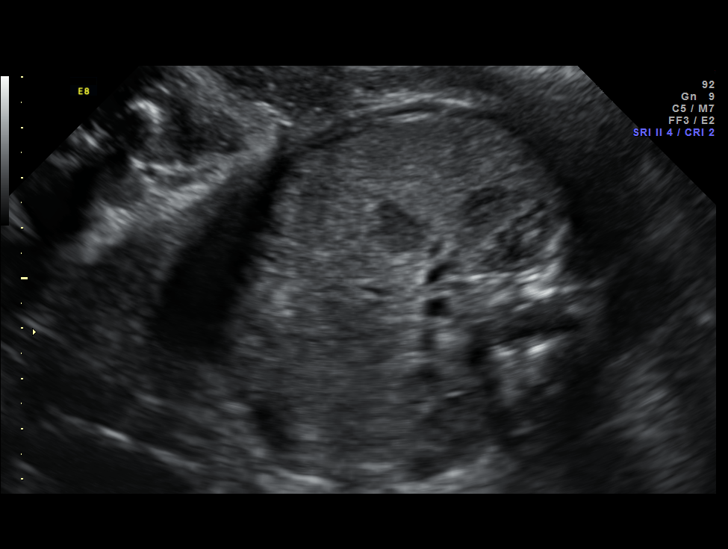
[im 36/51]
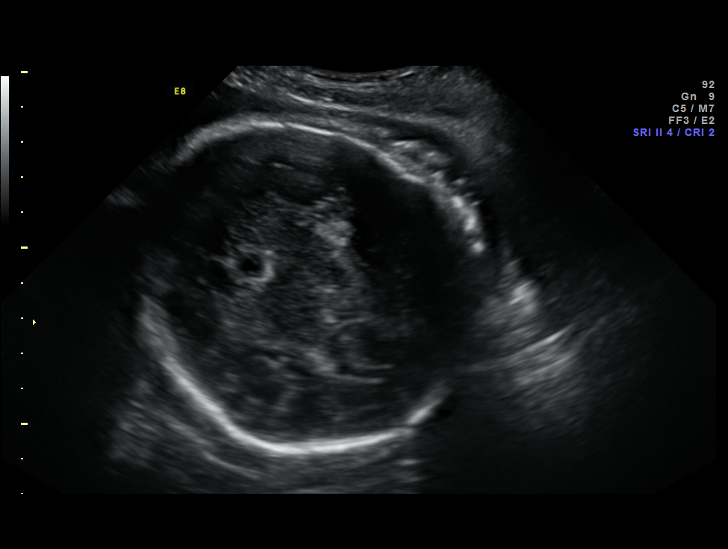
[im 39/51]
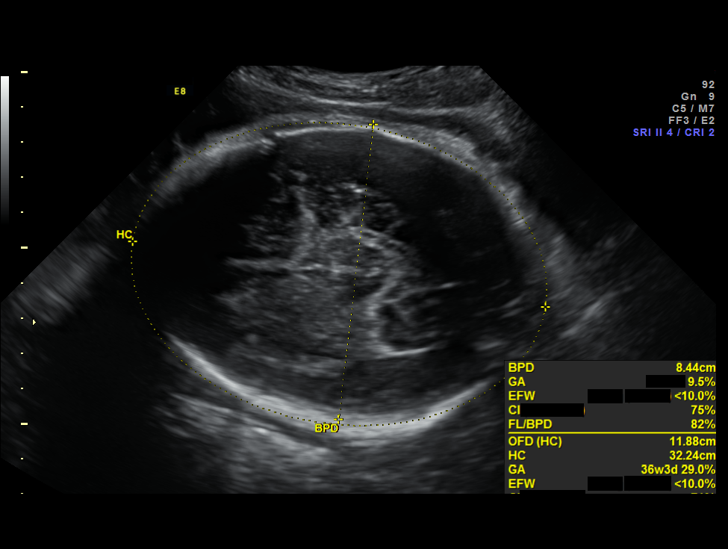
[im 43/51]
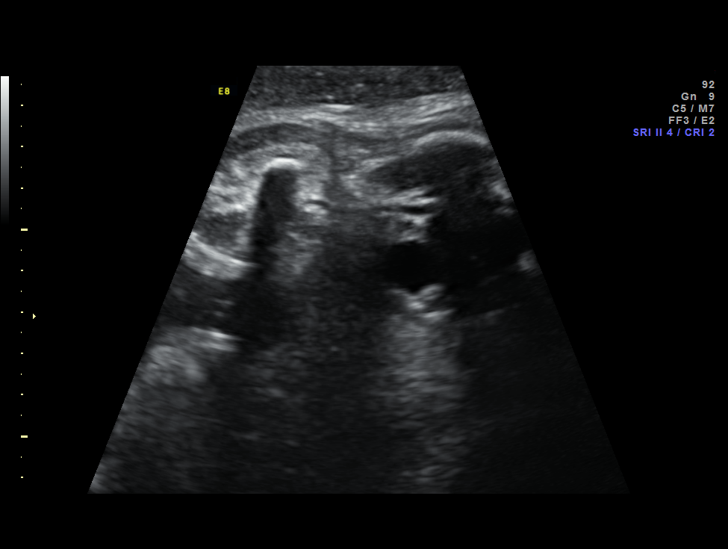
[im 47/51]
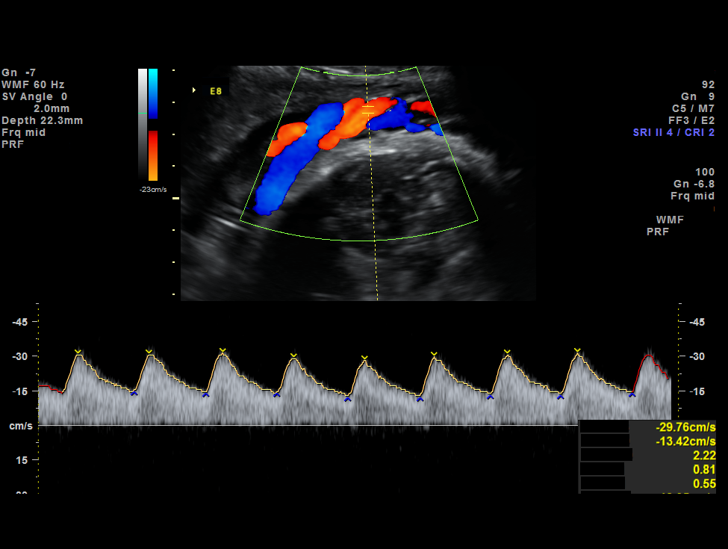
[im 51/51]
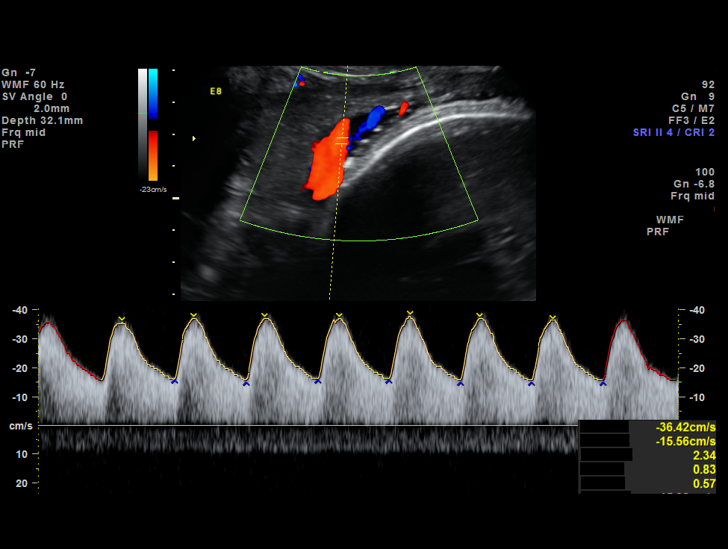

[14 of 28 positions shown; findings below may reference images not displayed]

Canned report from images found in remote index.

Refer to host system for actual result text.

## 2013-01-17 LAB — URINE CULTURE

## 2013-01-17 NOTE — ED Provider Notes (Signed)
Medical screening examination/treatment/procedure(s) were performed by non-physician practitioner and as supervising physician I was immediately available for consultation/collaboration.   Junius Argyle, MD 01/17/13 1116

## 2013-01-30 NOTE — ED Provider Notes (Signed)
Medical screening examination/treatment/procedure(s) were performed by non-physician practitioner and as supervising physician I was immediately available for consultation/collaboration.   Junius Argyle, MD 01/30/13 2014

## 2013-02-01 NOTE — ED Provider Notes (Signed)
Medical screening examination/treatment/procedure(s) were performed by non-physician practitioner and as supervising physician I was immediately available for consultation/collaboration.   Junius Argyle, MD 02/01/13 [phone]

## 2013-10-10 ENCOUNTER — Encounter (HOSPITAL_COMMUNITY): Payer: Self-pay | Admitting: Emergency Medicine

## 2013-10-10 ENCOUNTER — Emergency Department (HOSPITAL_COMMUNITY)
Admission: EM | Admit: 2013-10-10 | Discharge: 2013-10-10 | Discharge status: Against Medical Advice | Payer: BC Managed Care – PPO | Attending: Emergency Medicine | Admitting: Emergency Medicine

## 2013-10-10 DIAGNOSIS — R339 Retention of urine, unspecified: Secondary | ICD-10-CM | POA: Insufficient documentation

## 2013-10-10 DIAGNOSIS — R109 Unspecified abdominal pain: Secondary | ICD-10-CM | POA: Insufficient documentation

## 2013-10-10 LAB — URINE MICROSCOPIC-ADD ON

## 2013-10-10 LAB — URINALYSIS, ROUTINE W REFLEX MICROSCOPIC
Bilirubin Urine: NEGATIVE
GLUCOSE, UA: NEGATIVE mg/dL
Ketones, ur: 40 mg/dL — AB
Nitrite: NEGATIVE
PH: 6 (ref 5.0–8.0)
Protein, ur: 30 mg/dL — AB
Specific Gravity, Urine: 1.015 (ref 1.005–1.030)
Urobilinogen, UA: 1 mg/dL (ref 0.0–1.0)

## 2013-10-10 LAB — PREGNANCY, URINE: Preg Test, Ur: NEGATIVE

## 2013-10-10 NOTE — ED Notes (Addendum)
Pt comes up to Nurse first desk and advises that she is leaving, pt declines to sign out AMA.

## 2013-10-10 NOTE — ED Notes (Signed)
Pt c/o of lower abd cramping, urinary retention and tingling with urination. States urine is cloudy.

## 2013-10-11 ENCOUNTER — Ambulatory Visit: Payer: Self-pay | Admitting: Emergency Medicine

## 2013-10-11 VITALS — BP 122/64 | HR 74 | Temp 97.9°F | Resp 16 | Ht 63.5 in | Wt 99.2 lb

## 2013-10-11 DIAGNOSIS — R350 Frequency of micturition: Secondary | ICD-10-CM

## 2013-10-11 DIAGNOSIS — N3000 Acute cystitis without hematuria: Secondary | ICD-10-CM

## 2013-10-11 LAB — POCT UA - MICROSCOPIC ONLY
CASTS, UR, LPF, POC: NEGATIVE
CRYSTALS, UR, HPF, POC: NEGATIVE
CRYSTALS, UR, HPF, POC: NEGATIVE
Casts, Ur, LPF, POC: NEGATIVE
MUCUS UA: POSITIVE
Mucus, UA: POSITIVE
YEAST UA: NEGATIVE
Yeast, UA: NEGATIVE

## 2013-10-11 LAB — POCT URINALYSIS DIPSTICK
Bilirubin, UA: NEGATIVE
Bilirubin, UA: NEGATIVE
Glucose, UA: NEGATIVE
Glucose, UA: NEGATIVE
Ketones, UA: 15
Ketones, UA: 40
Leukocytes, UA: NEGATIVE
Nitrite, UA: NEGATIVE
Nitrite, UA: NEGATIVE
Protein, UA: 100
Protein, UA: 100
Spec Grav, UA: 1.02
Spec Grav, UA: 1.025
Urobilinogen, UA: 1
Urobilinogen, UA: 1
pH, UA: 5
pH, UA: 5.5

## 2013-10-11 LAB — POCT CBC
Granulocyte percent: 75 %G (ref 37–80)
HEMATOCRIT: 40.9 % (ref 37.7–47.9)
HEMOGLOBIN: 13.2 g/dL (ref 12.2–16.2)
LYMPH, POC: 2.2 (ref 0.6–3.4)
MCH: 29.6 pg (ref 27–31.2)
MCHC: 32.3 g/dL (ref 31.8–35.4)
MCV: 91.6 fL (ref 80–97)
MID (cbc): 0.9 (ref 0–0.9)
MPV: 8.4 fL (ref 0–99.8)
POC Granulocyte: 9.2 — AB (ref 2–6.9)
POC LYMPH PERCENT: 18 %L (ref 10–50)
POC MID %: 7 % (ref 0–12)
Platelet Count, POC: 204 10*3/uL (ref 142–424)
RBC: 4.46 M/uL (ref 4.04–5.48)
RDW, POC: 13 %
WBC: 12.2 10*3/uL — AB (ref 4.6–10.2)

## 2013-10-11 LAB — POCT URINE PREGNANCY: Preg Test, Ur: NEGATIVE

## 2013-10-11 MED ORDER — CIPROFLOXACIN HCL 500 MG PO TABS: 500.0000 mg | ORAL_TABLET | Freq: Two times a day (BID) | ORAL | Status: DC

## 2013-10-11 MED ORDER — PHENAZOPYRIDINE HCL 200 MG PO TABS: 200.0000 mg | ORAL_TABLET | Freq: Three times a day (TID) | ORAL | Status: DC | PRN

## 2013-10-11 NOTE — Progress Notes (Signed)
[facility] [address] [place] [address] [phone]  Date:  10/11/2013   Name:  Patricia Pennington   DOB:  02-Apr-1986   MRN:  [id number]  PCP:  No PCP Per Patient    Chief Complaint: Abdominal Pain, Urinary Frequency and Headache   History of Present Illness:  Patricia Pennington is a 28 y.o. very pleasant female patient who presents with the following:  Developed generalized abdominal pain Saturday night.  Has some dysuria, urgency and frequency.  Uses implanon but is due in august to have it removed.  Denies fever or chills.  Some nausea and vomiting.  No stool change.    There are no active problems to display for this patient.   Past Medical History  Diagnosis Date  . Allergy     History reviewed. No pertinent past surgical history.  History  Substance Use Topics  . Smoking status: Never Smoker   . Smokeless tobacco: Not on file  . Alcohol Use: Yes     Comment: sometime    Family History  Problem Relation Age of Onset  . Kidney disease Maternal Grandfather     No Known Allergies  Medication list has been reviewed and updated.  Current Outpatient Prescriptions on File Prior to Visit  Medication Sig Dispense Refill  . Naproxen Sodium (ALEVE PO) Take 2 tablets by mouth daily as needed (headache).      . doxycycline (VIBRAMYCIN) 100 MG capsule Take 1 capsule (100 mg total) by mouth 2 (two) times daily.  20 capsule  0  . metroNIDAZOLE (FLAGYL) 500 MG tablet Take 1 tablet (500 mg total) by mouth 2 (two) times daily. One po bid x 7 days  14 tablet  0   No current facility-administered medications on file prior to visit.    Review of Systems:  As per HPI, otherwise negative.    Physical Examination: Filed Vitals:   10/11/13 1024  BP: 122/64  Pulse: 74  Temp: 97.9 F (36.6 C)  Resp: 16   Filed Vitals:   10/11/13 1024  Height: 5' 3.5" (1.613 m)  Weight: 99 lb 3.2 oz (44.997 kg)   Body mass index is 17.29 kg/(m^2). Ideal Body Weight:  Weight in (lb) to have BMI = 25: 143.1  GEN: WDWN, NAD, Non-toxic, A & O x 3 HEENT: Atraumatic, Normocephalic. Neck supple. No masses, No LAD. Ears and Nose: No external deformity. CV: RRR, No M/G/R. No JVD. No thrill. No extra heart sounds. PULM: CTA B, no wheezes, crackles, rhonchi. No retractions. No resp. distress. No accessory muscle use. ABD: S, NT, ND, +BS. No rebound. No HSM. EXTR: No c/c/e NEURO Normal gait.  PSYCH: Normally interactive. Conversant. Not depressed or anxious appearing.  Calm demeanor.    Assessment and Plan: Cystitis cipro Pyridium   Signed,  Phillips Odor, MD   Results for orders placed in visit on 10/11/13  POCT URINALYSIS DIPSTICK      Result Value Ref Range   Color, UA yellow     Clarity, UA cloudy     Glucose, UA neg     Bilirubin, UA neg     Ketones, UA 15     Spec Grav, UA 1.020     Blood, UA larfge     pH, UA 5.0     Protein, UA 100     Urobilinogen, UA 1.0     Nitrite, UA neg     Leukocytes, UA Negative    POCT UA -  MICROSCOPIC ONLY      Result Value Ref Range   WBC, Ur, HPF, POC 20-25     RBC, urine, microscopic TNTC     Bacteria, U Microscopic 1+     Mucus, UA positive     Epithelial cells, urine per micros 10-15     Crystals, Ur, HPF, POC neg     Casts, Ur, LPF, POC neg     Yeast, UA neg     Results for orders placed in visit on 10/11/13  POCT URINALYSIS DIPSTICK      Result Value Ref Range   Color, UA yellow     Clarity, UA cloudy     Glucose, UA neg     Bilirubin, UA neg     Ketones, UA 15     Spec Grav, UA 1.020     Blood, UA larfge     pH, UA 5.0     Protein, UA 100     Urobilinogen, UA 1.0     Nitrite, UA neg     Leukocytes, UA Negative    POCT UA - MICROSCOPIC ONLY      Result Value Ref Range   WBC, Ur, HPF, POC 20-25     RBC, urine, microscopic TNTC     Bacteria, U Microscopic 1+     Mucus, UA positive     Epithelial cells, urine per micros 10-15     Crystals, Ur, HPF, POC neg     Casts, Ur, LPF,  POC neg     Yeast, UA neg    POCT URINE PREGNANCY      Result Value Ref Range   Preg Test, Ur Negative    POCT CBC      Result Value Ref Range   WBC 12.2 (*) 4.6 - 10.2 K/uL   Lymph, poc 2.2  0.6 - 3.4   POC LYMPH PERCENT 18.0  10 - 50 %L   MID (cbc) 0.9  0 - 0.9   POC MID % 7.0  0 - 12 %M   POC Granulocyte 9.2 (*) 2 - 6.9   Granulocyte percent 75.0  37 - 80 %G   RBC 4.46  4.04 - 5.48 M/uL   Hemoglobin 13.2  12.2 - 16.2 g/dL   HCT, POC 11.940.9  14.737.7 - 47.9 %   MCV 91.6  80 - 97 fL   MCH, POC 29.6  27 - 31.2 pg   MCHC 32.3  31.8 - 35.4 g/dL   RDW, POC 82.913.0     Platelet Count, POC 204  142 - 424 K/uL   MPV 8.4  0 - 99.8 fL  POCT URINALYSIS DIPSTICK      Result Value Ref Range   Color, UA light yellow/red     Clarity, UA cloudy     Glucose, UA neg     Bilirubin, UA neg     Ketones, UA 40     Spec Grav, UA 1.025     Blood, UA large     pH, UA 5.5     Protein, UA 100     Urobilinogen, UA 1.0     Nitrite, UA neg     Leukocytes, UA moderate (2+)    POCT UA - MICROSCOPIC ONLY      Result Value Ref Range   WBC, Ur, HPF, POC TNTC     RBC, urine, microscopic 18-25     Bacteria, U Microscopic 1+     Mucus, UA positive  Epithelial cells, urine per micros 1-2     Crystals, Ur, HPF, POC neg     Casts, Ur, LPF, POC neg     Yeast, UA neg

## 2017-11-01 ENCOUNTER — Encounter: Payer: Self-pay | Admitting: Physician Assistant

## 2017-11-01 ENCOUNTER — Ambulatory Visit: Payer: BLUE CROSS/BLUE SHIELD | Admitting: Physician Assistant

## 2017-11-01 ENCOUNTER — Other Ambulatory Visit: Payer: Self-pay

## 2017-11-01 VITALS — BP 104/70 | HR 77 | Temp 98.6°F | Ht 64.0 in | Wt 114.8 lb

## 2017-11-01 DIAGNOSIS — J029 Acute pharyngitis, unspecified: Secondary | ICD-10-CM

## 2017-11-01 DIAGNOSIS — R591 Generalized enlarged lymph nodes: Secondary | ICD-10-CM

## 2017-11-01 DIAGNOSIS — N750 Cyst of Bartholin's gland: Secondary | ICD-10-CM | POA: Insufficient documentation

## 2017-11-01 LAB — POCT RAPID STREP A (OFFICE): Rapid Strep A Screen: NEGATIVE

## 2017-11-01 MED ORDER — NAPROXEN 500 MG PO TABS: 500.0000 mg | ORAL_TABLET | Freq: Two times a day (BID) | ORAL | 0 refills | Status: DC

## 2017-11-01 MED ORDER — LIDOCAINE VISCOUS HCL 2 % MT SOLN: 5.0000 mL | OROMUCOSAL | 0 refills | Status: DC | PRN

## 2017-11-01 NOTE — Progress Notes (Signed)
11/02/2017 10:16 AM   DOB: [birth year] / MRN: [id number]  SUBJECTIVE:  Patricia Pennington is a 32 y.o. female presenting for sore throat and right sided lymphadenopathy.  She does smoke marijuana.  Associates difficulty swallowing and mild tactile fever and chills.  No cough but some nasal congestion.  No dizziness.  Getting worse and started three days ago.   She has No Known Allergies.   She  has a past medical history of Allergy.    She  reports that she has never smoked. She has never used smokeless tobacco. She reports that she drinks alcohol. She reports that she has current or past drug history. Drug: Marijuana. She  has no sexual activity history on file. The patient  has no past surgical history on file.  Her family history includes Diabetes in her father; Heart disease in her mother; Kidney disease in her maternal grandfather.  Review of Systems  Respiratory: Negative for shortness of breath.   Cardiovascular: Negative for chest pain.  Musculoskeletal: Negative for myalgias.  Psychiatric/Behavioral: Negative for depression.    The problem list and medications were reviewed and updated by myself where necessary and exist elsewhere in the encounter.   OBJECTIVE:  BP 104/70   Pulse 77   Temp 98.6 F (37 C) (Oral)   Ht  (1.626 m)   Wt 114 lb 12.8 oz (52.1 kg)   SpO2 100%   BMI 19.71 kg/m   Physical Exam  Constitutional: She is oriented to person, place, and time. She appears well-nourished. No distress.  HENT:  Right Ear: Hearing, tympanic membrane, external ear and ear canal normal.  Left Ear: Hearing, tympanic membrane, external ear and ear canal normal.  Nose: Nose normal. Right sinus exhibits no maxillary sinus tenderness and no frontal sinus tenderness. Left sinus exhibits no maxillary sinus tenderness and no frontal sinus tenderness.  Mouth/Throat: Uvula is midline and mucous membranes are normal. Mucous membranes are not dry. Oropharyngeal exudate and posterior  oropharyngeal erythema present. No posterior oropharyngeal edema or tonsillar abscesses.    Eyes: Pupils are equal, round, and reactive to light. EOM are normal.  Cardiovascular: Normal rate, regular rhythm, S1 normal, S2 normal, normal heart sounds and intact distal pulses. Exam reveals no gallop, no friction rub and no decreased pulses.  No murmur heard. Pulmonary/Chest: Effort normal. No stridor. No respiratory distress. She has no wheezes. She has no rales.  Abdominal: She exhibits no distension.  Musculoskeletal: She exhibits no edema, tenderness or deformity.  Lymphadenopathy:       Head (right side): No submandibular and no tonsillar adenopathy present.       Head (left side): No submandibular and no tonsillar adenopathy present.    She has no cervical adenopathy.  Neurological: She is alert and oriented to person, place, and time. No cranial nerve deficit. Gait normal.  Skin: Skin is dry. She is not diaphoretic.  Psychiatric: She has a normal mood and affect.  Vitals reviewed.   Results for orders placed or performed in visit on 11/01/17 (from the past 72 hour(s))  POCT rapid strep A     Status: None   Collection Time: 11/01/17  5:32 PM  Result Value Ref Range   Rapid Strep A Screen Negative Negative    No results found.  ASSESSMENT AND PLAN:  Terrea was seen today for lymphadenopathy.  Diagnoses and all orders for this visit:  Sore throat: Strep negative.  Patient prefers a watch and wait approach. Culture pending.  History reviewed and benign aside from marijuana use. FU in 2 weeks is strep negative and symtpoms persist.  -     POCT rapid strep A -     lidocaine (XYLOCAINE) 2 % solution; Use as directed 5-10 mLs in the mouth or throat as needed for mouth pain. -     naproxen (NAPROSYN) 500 MG tablet; Take 1 tablet (500 mg total) by mouth 2 (two) times daily with a meal.  Lymphadenopathy -     Culture, Group A Strep    The patient is advised to call or return to  clinic if she does not see an improvement in symptoms, or to seek the care of the closest emergency department if she worsens with the above plan.   Deliah Boston, MHS, PA-C Primary [facility] at [facility] 11/02/2017 10:16 AM

## 2017-11-01 NOTE — Patient Instructions (Addendum)
  I will call if this is strep.  Otherwise take the naprosyn and give this time.  Come back in three week if you are still having problem so we can expand the work up to include other causes.    IF you received an x-ray today, you will receive an invoice from [facility]. Please contact [facility] at [phone] with questions or concerns regarding your invoice.   IF you received labwork today, you will receive an invoice from [place]. Please contact [facility] at [phone] with questions or concerns regarding your invoice.   Our billing staff will not be able to assist you with questions regarding bills from these companies.  You will be contacted with the lab results as soon as they are available. The fastest way to get your results is to activate your My Chart account. Instructions are located on the last page of this paperwork. If you have not heard from [place] regarding the results in 2 weeks, please contact this office.

## 2017-11-04 LAB — CULTURE, GROUP A STREP

## 2017-11-06 ENCOUNTER — Telehealth: Payer: Self-pay | Admitting: Physician Assistant

## 2017-11-06 NOTE — Telephone Encounter (Signed)
Result is back. Please advise.

## 2017-11-06 NOTE — Telephone Encounter (Signed)
It was non group A strep.  I talked with her in the clinic about the result.  No change in the plan. Patricia BostonMichael Hildegard Hlavac, MS, PA-C 4:43 PM, 11/06/2017

## 2017-11-06 NOTE — Telephone Encounter (Signed)
Copied from CRM [phone]. Topic: Quick Communication - See Telephone Encounter >> Nov 06, 2017  4:25 PM Terisa Starraylor, Brittany L wrote: CRM for notification. See Telephone encounter for: 11/06/17.  Patient said she was in the office on Wednesday and saw Dr Chestine Sporelark. He did a strept test on her and it was negative but he sent off for a culture. Patient is requesting the results from that. Call back @ [phone]

## 2017-11-17 ENCOUNTER — Other Ambulatory Visit: Payer: Self-pay | Admitting: Physician Assistant

## 2017-11-17 DIAGNOSIS — J029 Acute pharyngitis, unspecified: Secondary | ICD-10-CM

## 2017-11-17 NOTE — Telephone Encounter (Signed)
Naproxen refill Last Refill:11/01/17 # 30 tablets No RF Last OV: 11/01/17  PCP: Michael Clark PA Pharmacy:[facility] [address] 

## 2017-11-20 NOTE — Telephone Encounter (Signed)
Naproxen refill Last Refill:11/01/17 # 30 tablets No RF Last OV: 11/01/17  PCP: Deliah BostonMichael Clark PA Pharm[facility] [address]

## 2018-03-05 ENCOUNTER — Ambulatory Visit (HOSPITAL_COMMUNITY)
Admission: EM | Admit: 2018-03-05 | Discharge: 2018-03-05 | Disposition: A | Discharge status: Home / Self Care | Payer: BLUE CROSS/BLUE SHIELD | Attending: Family Medicine | Admitting: Family Medicine

## 2018-03-05 ENCOUNTER — Encounter (HOSPITAL_COMMUNITY): Payer: Self-pay | Admitting: Emergency Medicine

## 2018-03-05 DIAGNOSIS — T7840XA Allergy, unspecified, initial encounter: Secondary | ICD-10-CM

## 2018-03-05 DIAGNOSIS — R2242 Localized swelling, mass and lump, left lower limb: Secondary | ICD-10-CM | POA: Diagnosis not present

## 2018-03-05 DIAGNOSIS — R2241 Localized swelling, mass and lump, right lower limb: Secondary | ICD-10-CM | POA: Diagnosis not present

## 2018-03-05 DIAGNOSIS — T63421A Toxic effect of venom of ants, accidental (unintentional), initial encounter: Secondary | ICD-10-CM | POA: Diagnosis not present

## 2018-03-05 MED ORDER — METHYLPREDNISOLONE SODIUM SUCC 125 MG IJ SOLR
INTRAMUSCULAR | Status: AC
  Filled 2018-03-05: qty 2

## 2018-03-05 MED ORDER — METHYLPREDNISOLONE SODIUM SUCC 125 MG IJ SOLR
80.0000 mg | Freq: Once | INTRAMUSCULAR | Status: AC
  Administered 2018-03-05: 80 mg via INTRAMUSCULAR

## 2018-03-05 NOTE — Discharge Instructions (Addendum)
We gave you a steroid injection here for the allergic reaction. Continue the Benadryl as needed for itching.  You can also try Zyrtec daily this is non-drowsy. Keep doing the ice and elevate in the feet Follow up as needed for continued or worsening symptoms

## 2018-03-05 NOTE — ED Triage Notes (Signed)
Pt states two days ago she stepped in an ant hill, bilateral feet swelling due to bites. Pt took benadryl and ice packs stating the stwelling has gone down.

## 2018-03-05 NOTE — ED Provider Notes (Signed)
[facility]    CSN: [id number] Arrival date & time: 03/05/18  1148     History   Chief Complaint Chief Complaint  Patient presents with  . Insect Bite    HPI Patricia Pennington is a 32 y.o. female.   Patient is a 32 year old who presents for bilateral foot swelling.  This started Friday after stepping and an ant heel.  She reports itching, burning to the area.  She has been using Benadryl and ice along with elevating her feet for the swelling and discomfort.  Her symptoms have remained the same and she is not seeing any relief from the treatment at home.  She has had reactions to mosquito bites and other insect bites in the past that are similar.  She denies any associated cough, wheezing, shortness of breath, throat swelling, trouble swallowing.   ROS per HPI      Past Medical History:  Diagnosis Date  . Allergy     Patient Active Problem List   Diagnosis Date Noted  . 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 No pertinent surgical history.  OB History    Gravida  1   Para      Term      Preterm      AB      Living        SAB      TAB      Ectopic      Multiple      Live Births               Home Medications    Prior to Admission medications   Medication Sig Start Date End Date Taking? Authorizing Provider  lidocaine (XYLOCAINE) 2 % solution Use as directed 5-10 mLs in the mouth or throat as needed for mouth pain. 11/01/17   Ofilia Neas, PA-C  medroxyPROGESTERone Acetate (DEPO-PROVERA) 150 MG/ML SUSY Depo-Provera 150 mg/mL intramuscular syringe  Inject 1 mL every 3 months by intramuscular route for 90 days.  pt tolerated injection. next due 12/13/2017. mbl,cma    [provider]  naproxen (NAPROSYN) 500 MG tablet TAKE 1 TABLET (500 MG TOTAL) BY MOUTH 2 (TWO) TIMES DAILY WITH A MEAL. 11/21/17   Ofilia Neas, PA-C    Family History Family History  Problem Relation Age of Onset  . Kidney disease  Maternal Grandfather   . Heart disease Mother   . Diabetes Father     Social History Social History   Tobacco Use  . Smoking status: Never Smoker  . Smokeless tobacco: Never Used  Substance Use Topics  . Alcohol use: Yes    Comment: sometime  . Drug use: Yes    Types: Marijuana     Allergies   Patient has no known allergies.   Review of Systems Review of Systems   Physical Exam Triage Vital Signs ED Triage Vitals  Enc Vitals Group     BP 03/05/18 1236 115/65     Pulse Rate 03/05/18 1235 78     Resp 03/05/18 1235 16     Temp 03/05/18 1235 98.3 F (36.8 C)     Temp src --      SpO2 03/05/18 1235 100 %     Weight --      Height --      Head Circumference --      Peak Flow --      Pain Score --      Pain  Loc --      Pain Edu? --      Excl. in GC? --    No data found.  Updated Vital Signs BP 115/65   Pulse 78   Temp 98.3 F (36.8 C)   Resp 16   SpO2 100%   Visual Acuity Right Eye Distance:   Left Eye Distance:   Bilateral Distance:    Right Eye Near:   Left Eye Near:    Bilateral Near:     Physical Exam  Constitutional: She is oriented to person, place, and time. She appears well-developed and well-nourished.  Very pleasant. Non toxic or ill appearing.     HENT:  Head: Normocephalic and atraumatic.  Eyes: Conjunctivae are normal.  Neck: Normal range of motion.  Pulmonary/Chest: Effort normal.  Musculoskeletal: Normal range of motion.  Moderate bilateral foot edema.  Pedal pulse intact with good sensation.  Mild erythema noted.   Neurological: She is alert and oriented to person, place, and time.  Skin: Skin is warm and dry.  Psychiatric: She has a normal mood and affect.  Nursing note and vitals reviewed.    UC Treatments / Results  Labs (all labs ordered are listed, but only abnormal results are displayed) Labs Reviewed - No data to display  EKG None  Radiology No results found.  Procedures Procedures (including critical  care time)  Medications Ordered in UC Medications  methylPREDNISolone sodium succinate (SOLU-MEDROL) 125 mg/2 mL injection 80 mg (has no administration in time range)    Initial Impression / Assessment and Plan / UC Course  I have reviewed the triage vital signs and the nursing notes.  Pertinent labs & imaging results that were available during my care of the patient were reviewed by me and considered in my medical decision making (see chart for details).     We will give steroid injection in clinic for the allergic reaction. We will have her continue the Benadryl as needed and Zyrtec daily Elevate the feet Monitor for worsening symptoms. Final Clinical Impressions(s) / UC Diagnoses   Final diagnoses:  Allergic reaction, initial encounter     Discharge Instructions     We gave you a steroid injection here for the allergic reaction. Continue the Benadryl as needed for itching.  You can also try Zyrtec daily this is non-drowsy. Keep doing the ice and elevate in the feet Follow up as needed for continued or worsening symptoms     ED Prescriptions    None     Controlled Substance Prescriptions [place] Controlled Substance Registry consulted? Not Applicable   Janace Aris, NP 03/05/18 1306

## 2021-02-16 ENCOUNTER — Ambulatory Visit (HOSPITAL_COMMUNITY)
Admission: EM | Admit: 2021-02-16 | Discharge: 2021-02-16 | Disposition: A | Discharge status: Home / Self Care | Payer: Self-pay | Attending: Student | Admitting: Student

## 2021-02-16 ENCOUNTER — Encounter (HOSPITAL_COMMUNITY): Payer: Self-pay

## 2021-02-16 ENCOUNTER — Other Ambulatory Visit: Payer: Self-pay

## 2021-02-16 DIAGNOSIS — L03317 Cellulitis of buttock: Secondary | ICD-10-CM

## 2021-02-16 DIAGNOSIS — W57XXXA Bitten or stung by nonvenomous insect and other nonvenomous arthropods, initial encounter: Secondary | ICD-10-CM

## 2021-02-16 MED ORDER — CEPHALEXIN 500 MG PO CAPS: 500.0000 mg | ORAL_CAPSULE | Freq: Four times a day (QID) | ORAL | 0 refills | Status: DC

## 2021-02-16 NOTE — ED Triage Notes (Signed)
Reports spider bite in left buttock and left lower leg x 1 day. Pt reports swelling, pain and redness in the bite areas.

## 2021-02-16 NOTE — ED Provider Notes (Signed)
[facility]    CSN: [id number] Arrival date & time: 02/16/21  1005      History   Chief Complaint Chief Complaint  Patient presents with   Insect Bite    HPI Anthony Bedonie is a 35 y.o. female presenting with insect bites for 2 days.  Medical history noncontributory.  States 2 insect bites to her left leg following outdoor exposure, tender.  Some pus is come out of the 1 on the left buttock.  Denies fever/chills.  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 No pertinent surgical history.  OB History     Gravida  1   Para      Term      Preterm      AB      Living         SAB      IAB      Ectopic      Multiple      Live Births               Home Medications    Prior to Admission medications   Medication Sig Start Date End Date Taking? Authorizing Provider  cephALEXin (KEFLEX) 500 MG capsule Take 1 capsule (500 mg total) by mouth 4 (four) times daily. 02/16/21  Yes Rhys Martini, PA-C  lidocaine (XYLOCAINE) 2 % solution Use as directed 5-10 mLs in the mouth or throat as needed for mouth pain. 11/01/17   Ofilia Neas, PA-C  medroxyPROGESTERone Acetate 150 MG/ML SUSY Depo-Provera 150 mg/mL intramuscular syringe  Inject 1 mL every 3 months by intramuscular route for 90 days.  pt tolerated injection. next due 12/13/2017. mbl,cma    [provider]  naproxen (NAPROSYN) 500 MG tablet TAKE 1 TABLET (500 MG TOTAL) BY MOUTH 2 (TWO) TIMES DAILY WITH A MEAL. 11/21/17   Ofilia Neas, PA-C    Family History Family History  Problem Relation Age of Onset   Kidney disease Maternal Grandfather    Heart disease Mother    Diabetes Father     Social History Social History   Tobacco Use   Smoking status: Never   Smokeless tobacco: Never  Substance Use Topics   Alcohol use: Yes    Comment:  sometime   Drug use: Yes    Types: Marijuana     Allergies   Patient has no known allergies.   Review of Systems Review of Systems  Skin:  Positive for color change.  All other systems reviewed and are negative.   Physical Exam Triage Vital Signs ED Triage Vitals  Enc Vitals Group     BP 02/16/21 1113 (!) 149/94     Pulse Rate 02/16/21 1113 86     Resp 02/16/21 1113 16     Temp 02/16/21 1113 98 F (36.7 C)     Temp Source 02/16/21 1113 Oral     SpO2 02/16/21 1113 98 %     Weight --      Height --      Head Circumference --      Peak Flow --      Pain Score 02/16/21 1110 8     Pain Loc --      Pain Edu? --      Excl. in GC? --    No  data found.  Updated Vital Signs BP (!) 149/94 (BP Location: Right Arm)   Pulse 86   Temp 98 F (36.7 C) (Oral)   Resp 16   SpO2 98%   Visual Acuity Right Eye Distance:   Left Eye Distance:   Bilateral Distance:    Right Eye Near:   Left Eye Near:    Bilateral Near:     Physical Exam Vitals reviewed.  Constitutional:      General: She is not in acute distress.    Appearance: Normal appearance. She is not ill-appearing or diaphoretic.  HENT:     Head: Normocephalic and atraumatic.  Cardiovascular:     Rate and Rhythm: Normal rate and regular rhythm.     Heart sounds: Normal heart sounds.  Pulmonary:     Effort: Pulmonary effort is normal.     Breath sounds: Normal breath sounds.  Skin:    General: Skin is warm.     Comments: L calf- 4x4cm area of erythema without induration or warmth.  L buttock- 4x5cm area of erythema, with 1cm central induration. No fluctuance, warmth, discharge expressed.  Neurological:     General: No focal deficit present.     Mental Status: She is alert and oriented to person, place, and time.  Psychiatric:        Mood and Affect: Mood normal.        Behavior: Behavior normal.        Thought Content: Thought content normal.        Judgment: Judgment normal.     UC Treatments / Results   Labs (all labs ordered are listed, but only abnormal results are displayed) Labs Reviewed - No data to display  EKG   Radiology No results found.  Procedures Procedures (including critical care time)  Medications Ordered in UC Medications - No data to display  Initial Impression / Assessment and Plan / UC Course  I have reviewed the triage vital signs and the nursing notes.  Pertinent labs & imaging results that were available during my care of the patient were reviewed by me and considered in my medical decision making (see chart for details).     This patient is a very pleasant 35 y.o. year old female presenting with cellulitis L buttock. Afebrile, nontachy. Keflex as below. Wound care discussed. ED return precautions discussed. Patient verbalizes understanding and agreement.  .   Final Clinical Impressions(s) / UC Diagnoses   Final diagnoses:  Cellulitis of buttock  Insect bite, unspecified site, initial encounter     Discharge Instructions      -Start the antibiotic: Keflex, 4x daily x5 days. You can take this with food if you have a sensitive stomach. -Wash with gentle soap and water only. Avoid hydrogen peroxide and alcohol to cleanse! -Seek additional medical attention if symptoms getting worse instead of better, like increasing redness and swelling, new/worsening pus, etc.   ED Prescriptions     Medication Sig Dispense Auth. Provider   cephALEXin (KEFLEX) 500 MG capsule Take 1 capsule (500 mg total) by mouth 4 (four) times daily. 20 capsule Rhys Martini, PA-C      PDMP not reviewed this encounter.   Rhys Martini, PA-C 02/16/21 1142

## 2021-02-16 NOTE — Discharge Instructions (Addendum)
-  Start the antibiotic: Keflex, 4x daily x5 days. You can take this with food if you have a sensitive stomach. -Wash with gentle soap and water only. Avoid hydrogen peroxide and alcohol to cleanse! -Seek additional medical attention if symptoms getting worse instead of better, like increasing redness and swelling, new/worsening pus, etc.

## 2021-06-06 NOTE — L&D Delivery Note (Signed)
Patient: Pati Thinnes MRN: [id number]  GBS status: Neg  Patient is a 36 y.o. now G3P1011 s/p NSVD at [redacted]w[redacted]d, who was admitted for SOL. AROM at the time of delivery prior to delivery with clear fluid.    Delivery Note At 7:16 AM a viable female was delivered via Vaginal, Spontaneous (Presentation: Right Occiput Anterior).  APGAR: , ; weight  .   Placenta status: Spontaneous, Intact.  Cord: 3 vessels with the following complications: None.    Head delivered ROA. No nuchal cord present. Shoulder and body delivered in usual fashion. Infant with spontaneous cry, placed on mother's abdomen, dried and bulb suctioned. Cord clamped x 2 after 1-minute delay, and cut by family member. Cord blood drawn. Placenta delivered spontaneously with gentle cord traction. Fundus firm with massage and Pitocin. Perineal inspection deferred per patients request but bleeding well controled. Will be reevaluated by primary OB provider   Anesthesia: None Episiotomy: None Est. Blood Loss (mL): 87  Mom to postpartum.  Baby to Couplet care / Skin to Skin.  Celedonio Savage 01/24/2022, 7:39 AM

## 2021-07-15 LAB — OB RESULTS CONSOLE HIV ANTIBODY (ROUTINE TESTING): HIV: NONREACTIVE

## 2021-07-15 LAB — OB RESULTS CONSOLE GC/CHLAMYDIA
Chlamydia: NEGATIVE
Neisseria Gonorrhea: NEGATIVE

## 2021-07-15 LAB — HEPATITIS C ANTIBODY: HCV Ab: NEGATIVE

## 2021-07-15 LAB — OB RESULTS CONSOLE RUBELLA ANTIBODY, IGM: Rubella: IMMUNE

## 2021-07-15 LAB — OB RESULTS CONSOLE HEPATITIS B SURFACE ANTIGEN: Hepatitis B Surface Ag: NEGATIVE

## 2021-11-22 LAB — OB RESULTS CONSOLE RPR: RPR: NONREACTIVE

## 2022-01-08 ENCOUNTER — Encounter (HOSPITAL_COMMUNITY): Payer: Self-pay | Admitting: Obstetrics and Gynecology

## 2022-01-08 ENCOUNTER — Inpatient Hospital Stay (HOSPITAL_COMMUNITY)
Admission: AD | Admit: 2022-01-08 | Discharge: 2022-01-08 | Disposition: A | Discharge status: Home / Self Care | Payer: BC Managed Care – PPO | Attending: Obstetrics and Gynecology | Admitting: Obstetrics and Gynecology

## 2022-01-08 ENCOUNTER — Encounter (HOSPITAL_COMMUNITY): Payer: Self-pay

## 2022-01-08 DIAGNOSIS — O26893 Other specified pregnancy related conditions, third trimester: Secondary | ICD-10-CM | POA: Diagnosis present

## 2022-01-08 DIAGNOSIS — Z3A35 35 weeks gestation of pregnancy: Secondary | ICD-10-CM | POA: Diagnosis not present

## 2022-01-08 DIAGNOSIS — O09523 Supervision of elderly multigravida, third trimester: Secondary | ICD-10-CM | POA: Insufficient documentation

## 2022-01-08 DIAGNOSIS — N750 Cyst of Bartholin's gland: Secondary | ICD-10-CM | POA: Diagnosis not present

## 2022-01-08 HISTORY — DX: Palpitations: R00.2

## 2022-01-08 LAB — URINALYSIS, ROUTINE W REFLEX MICROSCOPIC
Bilirubin Urine: NEGATIVE
Glucose, UA: NEGATIVE mg/dL
Hgb urine dipstick: NEGATIVE
Ketones, ur: NEGATIVE mg/dL
Nitrite: NEGATIVE
Protein, ur: 100 mg/dL — AB
Specific Gravity, Urine: 1.027 (ref 1.005–1.030)
Squamous Epithelial / LPF: >50 — ABNORMAL HIGH (ref 0–5)
WBC, UA: >50 WBC/hpf — ABNORMAL HIGH (ref 0–5)
pH: 5 (ref 5.0–8.0)

## 2022-01-08 MED ORDER — LIDOCAINE HCL (PF) 1 % IJ SOLN
5.0000 mL | Freq: Once | INTRAMUSCULAR | Status: AC
  Administered 2022-01-08: 5 mL via INTRADERMAL
  Filled 2022-01-08: qty 5

## 2022-01-08 NOTE — MAU Note (Signed)
.  Patricia Pennington is a 36 y.o. at [redacted]w[redacted]d here in MAU reporting: pain on her left side due to [facility] cyst that has swollen since Thursday night. Pt states she called her OB and they told her to come in to MAU to have in drained. Pt denies VB, DFM, LOF, CTX, and complications in the pregnancy. Pt taking antibiotics.   Onset of complaint: Thursday  Pain score: 10/10 Vitals:   01/08/22 2049  BP: 133/72  Pulse: 99  Resp: 18  Temp: 98.3 F (36.8 C)  SpO2: 99%     FHT:157 Lab orders placed from triage:  ua

## 2022-01-08 NOTE — MAU Note (Signed)
Pt remains unaware of contractions. Denies pain or uterine cramping. SVE deferred

## 2022-01-08 NOTE — MAU Note (Signed)
Headache since Thursday-has taken tylenol without relief. Took 2 extra strength tylenol this am without relief.

## 2022-01-08 NOTE — MAU Provider Note (Signed)
History     CSN: [id number]  Arrival date and time: 01/08/22 2100   Event Date/Time   First Provider Initiated Contact with Patient 01/08/22 2130      Chief Complaint  Patient presents with   Cyst   Patricia Pennington is a 36 y.o. G3P1011 at [redacted]w[redacted]d who receives care at [facility]. She reports her next appt is Thursday. She presents today for Cyst.  She states her Bartholin cyst has been present for "many years." She reports on Thursday the cysts began to get larger and start to cause pain. She reports she has been taking antibiotics and oxycodone with no relief of pain.  She states she has been ambulating more today because of her baby shower.  She denies abnormal discharge, bleeding, or leaking.  She endorses fetal movement and denies abdominal cramping or contractions.     OB History     Gravida  3   Para  1   Term  1   Preterm      AB  1   Living  1      SAB  1   IAB      Ectopic      Multiple      Live Births  1           Past Medical History:  Diagnosis Date   Allergy    Palpitations    pt report not diagnosed by MD-has had since young child    Past Surgical History:  Procedure Laterality Date   NO PAST SURGERIES      Family History  Problem Relation Age of Onset   Kidney disease Maternal Grandfather    Heart disease Mother    Diabetes Father     Social History   Tobacco Use   Smoking status: Never   Smokeless tobacco: Never  Substance Use Topics   Alcohol use: Yes    Comment: sometime   Drug use: Not Currently    Types: Marijuana    Allergies: No Known Allergies  Medications Prior to Admission  Medication Sig Dispense Refill Last Dose   acetaminophen (TYLENOL) 500 MG tablet Take 1,000 mg by mouth every 6 (six) hours as needed.   01/08/2022 at 1500   cephALEXin (KEFLEX) 500 MG capsule Take 1 capsule (500 mg total) by mouth 4 (four) times daily. 20 capsule 0 01/08/2022 at 1300   Prenatal Vit-Fe Fumarate-FA (PRENATAL VITAMINS PO)  Take 1 tablet by mouth daily.   01/07/2022   lidocaine (XYLOCAINE) 2 % solution Use as directed 5-10 mLs in the mouth or throat as needed for mouth pain. 200 mL 0    medroxyPROGESTERone Acetate 150 MG/ML SUSY Depo-Provera 150 mg/mL intramuscular syringe  Inject 1 mL every 3 months by intramuscular route for 90 days.  pt tolerated injection. next due 12/13/2017. mbl,cma      naproxen (NAPROSYN) 500 MG tablet TAKE 1 TABLET (500 MG TOTAL) BY MOUTH 2 (TWO) TIMES DAILY WITH A MEAL. 30 tablet 0     Review of Systems  Constitutional:  Negative for chills and fever.  Gastrointestinal:  Negative for abdominal pain, nausea and vomiting.  Genitourinary:  Negative for difficulty urinating, dysuria, vaginal bleeding and vaginal discharge.  Neurological:  Negative for dizziness and headaches.   Physical Exam   There were no vitals taken for this visit.  Physical Exam Constitutional:      General: She is not in acute distress.    Appearance: Normal appearance.  HENT:  Head: Normocephalic and atraumatic.  Eyes:     Conjunctiva/sclera: Conjunctivae normal.  Cardiovascular:     Rate and Rhythm: Normal rate.  Pulmonary:     Effort: Pulmonary effort is normal. No respiratory distress.  Abdominal:     Tenderness: There is no abdominal tenderness.     Comments: Gravid, Appears AGA  Genitourinary:    Comments: Left Labial Cyst-Pictures below Musculoskeletal:        General: Normal range of motion.     Cervical back: Normal range of motion.  Neurological:     Mental Status: She is alert and oriented to person, place, and time.  Psychiatric:        Mood and Affect: Mood normal.        Behavior: Behavior normal.        Fetal Assessment 150 bpm, Mod Var, -Decels, +Accels Toco: Irregular  MAU Course   Results for orders placed or performed during the hospital encounter of 01/08/22 (from the past 24 hour(s))  Urinalysis, Routine w reflex microscopic     Status: Abnormal   Collection Time:  01/08/22  8:50 PM  Result Value Ref Range   Color, Urine YELLOW YELLOW   APPearance CLOUDY (A) CLEAR   Specific Gravity, Urine 1.027 1.005 - 1.030   pH 5.0 5.0 - 8.0   Glucose, UA NEGATIVE NEGATIVE mg/dL   Hgb urine dipstick NEGATIVE NEGATIVE   Bilirubin Urine NEGATIVE NEGATIVE   Ketones, ur NEGATIVE NEGATIVE mg/dL   Protein, ur 099 (A) NEGATIVE mg/dL   Nitrite NEGATIVE NEGATIVE   Leukocytes,Ua MODERATE (A) NEGATIVE   RBC / HPF 6-10 0 - 5 RBC/hpf   WBC, UA >50 (H) 0 - 5 WBC/hpf   Bacteria, UA MANY (A) NONE SEEN   Squamous Epithelial / LPF >50 (H) 0 - 5   Mucus PRESENT    Ca Oxalate Crys, UA PRESENT    No results found.  MDM PE Labs: UA, UC EFM Assessment and Plan  36 year old G3P1011  SIUP at 35.6weeks Cat I FT Batholin's Cyst  -Reviewed I&D procedure including risks and benefits.  Discussed recommended placement of word catheter and recovery time.  -Patient agreeable and materials gathered. -Upon examination, cyst found to be larger than anticipated.  -Dr. Shawnie Pons called to bedside and procedure performed.  -Patient tolerated well.  -Instructed to continue Keflex and oxycodone for pain.  -Precautions Given. -Discharged to home in stable condition.   Cherre Robins MSN, CNM 01/08/2022, 10:23 PM

## 2022-01-08 NOTE — MAU Note (Signed)
Pt instructed in care of vaginal incision with drain per Dr.Pratt. instructed with use of sitz bath.

## 2022-01-10 LAB — CULTURE, OB URINE: Culture: 80000 — AB

## 2022-01-13 LAB — OB RESULTS CONSOLE GBS: GBS: NEGATIVE

## 2022-01-24 ENCOUNTER — Other Ambulatory Visit: Payer: Self-pay

## 2022-01-24 ENCOUNTER — Inpatient Hospital Stay (HOSPITAL_COMMUNITY)
Admission: AD | Admit: 2022-01-24 | Discharge: 2022-01-25 | DRG: 807 | Disposition: A | Discharge status: Home / Self Care | Payer: BC Managed Care – PPO | Attending: Obstetrics and Gynecology | Admitting: Obstetrics and Gynecology

## 2022-01-24 ENCOUNTER — Encounter (HOSPITAL_COMMUNITY): Payer: Self-pay | Admitting: Obstetrics and Gynecology

## 2022-01-24 DIAGNOSIS — Z3A38 38 weeks gestation of pregnancy: Secondary | ICD-10-CM

## 2022-01-24 DIAGNOSIS — Z960 Presence of urogenital implants: Secondary | ICD-10-CM | POA: Diagnosis present

## 2022-01-24 DIAGNOSIS — O26893 Other specified pregnancy related conditions, third trimester: Secondary | ICD-10-CM | POA: Diagnosis present

## 2022-01-24 LAB — CBC
HCT: 36.9 % (ref 36.0–46.0)
Hemoglobin: 12.3 g/dL (ref 12.0–15.0)
MCH: 30.3 pg (ref 26.0–34.0)
MCHC: 33.3 g/dL (ref 30.0–36.0)
MCV: 90.9 fL (ref 80.0–100.0)
Platelets: 287 10*3/uL (ref 150–400)
RBC: 4.06 MIL/uL (ref 3.87–5.11)
RDW: 13.1 % (ref 11.5–15.5)
WBC: 11.3 10*3/uL — ABNORMAL HIGH (ref 4.0–10.5)
nRBC: 0 % (ref 0.0–0.2)

## 2022-01-24 LAB — TYPE AND SCREEN
ABO/RH(D): A POS
Antibody Screen: NEGATIVE

## 2022-01-24 LAB — RPR: RPR Ser Ql: NONREACTIVE

## 2022-01-24 MED ORDER — SENNOSIDES-DOCUSATE SODIUM 8.6-50 MG PO TABS
2.0000 | ORAL_TABLET | ORAL | Status: DC
  Administered 2022-01-24 – 2022-01-25 (×2): 2 via ORAL
  Filled 2022-01-24 (×2): qty 2

## 2022-01-24 MED ORDER — ONDANSETRON HCL 4 MG/2ML IJ SOLN: 4.0000 mg | Freq: Four times a day (QID) | INTRAMUSCULAR | Status: DC | PRN

## 2022-01-24 MED ORDER — ONDANSETRON HCL 4 MG/2ML IJ SOLN: 4.0000 mg | INTRAMUSCULAR | Status: DC | PRN

## 2022-01-24 MED ORDER — SIMETHICONE 80 MG PO CHEW: 80.0000 mg | CHEWABLE_TABLET | ORAL | Status: DC | PRN

## 2022-01-24 MED ORDER — OXYCODONE-ACETAMINOPHEN 5-325 MG PO TABS: 2.0000 | ORAL_TABLET | ORAL | Status: DC | PRN

## 2022-01-24 MED ORDER — DIBUCAINE (PERIANAL) 1 % EX OINT
1.0000 | TOPICAL_OINTMENT | CUTANEOUS | Status: DC | PRN
Start: 2022-01-24 — End: 2022-01-25

## 2022-01-24 MED ORDER — ZOLPIDEM TARTRATE 5 MG PO TABS: 5.0000 mg | ORAL_TABLET | Freq: Every evening | ORAL | Status: DC | PRN

## 2022-01-24 MED ORDER — TETANUS-DIPHTH-ACELL PERTUSSIS 5-2.5-18.5 LF-MCG/0.5 IM SUSY: 0.5000 mL | PREFILLED_SYRINGE | Freq: Once | INTRAMUSCULAR | Status: DC

## 2022-01-24 MED ORDER — OXYTOCIN BOLUS FROM INFUSION
333.0000 mL | Freq: Once | INTRAVENOUS | Status: AC
  Administered 2022-01-24: 333 mL via INTRAVENOUS

## 2022-01-24 MED ORDER — OXYCODONE-ACETAMINOPHEN 5-325 MG PO TABS: 1.0000 | ORAL_TABLET | ORAL | Status: DC | PRN

## 2022-01-24 MED ORDER — LIDOCAINE HCL (PF) 1 % IJ SOLN
INTRAMUSCULAR | Status: AC
  Filled 2022-01-24: qty 30

## 2022-01-24 MED ORDER — LACTATED RINGERS IV SOLN
INTRAVENOUS | Status: DC
Start: 2022-01-24 — End: 2022-01-24

## 2022-01-24 MED ORDER — PRENATAL MULTIVITAMIN CH
1.0000 | ORAL_TABLET | Freq: Every day | ORAL | Status: DC
  Administered 2022-01-24 – 2022-01-25 (×2): 1 via ORAL
  Filled 2022-01-24 (×2): qty 1

## 2022-01-24 MED ORDER — DIPHENHYDRAMINE HCL 25 MG PO CAPS: 25.0000 mg | ORAL_CAPSULE | Freq: Four times a day (QID) | ORAL | Status: DC | PRN

## 2022-01-24 MED ORDER — LACTATED RINGERS IV SOLN: 500.0000 mL | INTRAVENOUS | Status: DC | PRN

## 2022-01-24 MED ORDER — BENZOCAINE-MENTHOL 20-0.5 % EX AERO
1.0000 | INHALATION_SPRAY | CUTANEOUS | Status: DC | PRN
  Administered 2022-01-24 – 2022-01-25 (×2): 1 via TOPICAL

## 2022-01-24 MED ORDER — FLEET ENEMA 7-19 GM/118ML RE ENEM: 1.0000 | ENEMA | RECTAL | Status: DC | PRN

## 2022-01-24 MED ORDER — ACETAMINOPHEN 325 MG PO TABS
650.0000 mg | ORAL_TABLET | ORAL | Status: DC | PRN
  Administered 2022-01-24 – 2022-01-25 (×5): 650 mg via ORAL
  Filled 2022-01-24 (×5): qty 2

## 2022-01-24 MED ORDER — ACETAMINOPHEN 325 MG PO TABS
650.0000 mg | ORAL_TABLET | ORAL | Status: DC | PRN
  Administered 2022-01-24: 650 mg via ORAL
  Filled 2022-01-24: qty 2

## 2022-01-24 MED ORDER — ONDANSETRON HCL 4 MG PO TABS: 4.0000 mg | ORAL_TABLET | ORAL | Status: DC | PRN

## 2022-01-24 MED ORDER — LIDOCAINE HCL (PF) 1 % IJ SOLN
30.0000 mL | INTRAMUSCULAR | Status: DC | PRN
Start: 2022-01-24 — End: 2022-01-24

## 2022-01-24 MED ORDER — COCONUT OIL OIL: 1.0000 | TOPICAL_OIL | Status: DC | PRN

## 2022-01-24 MED ORDER — OXYTOCIN-SODIUM CHLORIDE 30-0.9 UT/500ML-% IV SOLN
INTRAVENOUS | Status: AC
  Filled 2022-01-24: qty 500

## 2022-01-24 MED ORDER — WITCH HAZEL-GLYCERIN EX PADS
1.0000 | MEDICATED_PAD | CUTANEOUS | Status: DC | PRN
  Administered 2022-01-24: 1 via TOPICAL

## 2022-01-24 MED ORDER — SOD CITRATE-CITRIC ACID 500-334 MG/5ML PO SOLN: 30.0000 mL | ORAL | Status: DC | PRN

## 2022-01-24 MED ORDER — OXYTOCIN-SODIUM CHLORIDE 30-0.9 UT/500ML-% IV SOLN
2.5000 [IU]/h | INTRAVENOUS | Status: DC
  Administered 2022-01-24: 2.5 [IU]/h via INTRAVENOUS

## 2022-01-24 MED ORDER — IBUPROFEN 600 MG PO TABS
600.0000 mg | ORAL_TABLET | Freq: Four times a day (QID) | ORAL | Status: DC
  Administered 2022-01-24 – 2022-01-25 (×5): 600 mg via ORAL
  Filled 2022-01-24 (×5): qty 1

## 2022-01-24 NOTE — Lactation Note (Signed)
This note was copied from a baby's chart. Lactation Consultation Note  Patient Name: Patricia Pennington AJOIN'O Date: 01/24/2022 Reason for consult: L&D Initial assessment Age:35 hours  Assisted with latch with ease.  Lactation to follow up on MBU.    LATCH Score Latch: Grasps breast easily, tongue down, lips flanged, rhythmical sucking.  Audible Swallowing: A few with stimulation  Type of Nipple: Everted at rest and after stimulation  Comfort (Breast/Nipple): Soft / non-tender  Hold (Positioning): Assistance needed to correctly position infant at breast and maintain latch.  LATCH Score: 8   Interventions Interventions: Assisted with latch;Education  Consult Status Consult Status: Follow-up from L&D    Dahlia Byes [facility] 01/24/2022, 8:43 AM

## 2022-01-24 NOTE — Social Work (Addendum)
CSW received and acknowledges consult for EDPS of 9.    Consult screened out due to 9 on EDPS does not warrant a CSW consult.  MOB whom scores are greater than 9/yes to question 10 on Edinburgh Postpartum Depression Screen warrants a CSW consult.   Samira Acero, MSW, LCSW [facility]  Clinical Social Worker  [phone] 01/24/2022  11:31 AM  

## 2022-01-24 NOTE — MAU Note (Signed)
.  Patricia Pennington is a 36 y.o. at [redacted]w[redacted]d here in MAU reporting: ctx started last night at midnight. LMP:  Onset of complaint: 12am Pain score: 10 There were no vitals filed for this visit.   FHT:130 Lab orders placed from triage:

## 2022-01-24 NOTE — H&P (Signed)
Patricia Pennington is a 36 y.o. female G3P1011 at 39 1/7 weeks (EDD 02/06/22 by LMP c/w 11 week [place]) presented to MAU in active labor at 0700am and found to be 8 cm dilated.  I was called at 705 am and immediately came to hospital.  Pt delivered precipitously at 7:14am before I arrived by [facility] fellow.   Prenatal care significant for AMA with normal NIPT.  Pt also had a Bartholin's duct abscess with a Word catheter placed on 01/09/22 that is still in place and that she delivered past.  OB History     Gravida  3   Para  1   Term  1   Preterm      AB  1   Living  1      SAB  1   IAB      Ectopic      Multiple      Live Births  1         11-30-2010, 37 wks F, 5lbs 7oz, Vaginal Delivery  03-06-2021 SAB  Past Medical History:  Diagnosis Date   Allergy    Palpitations    pt report not diagnosed by MD-has had since young child   Past Surgical History:  Procedure Laterality Date   NO PAST SURGERIES     Family History: family history includes Diabetes in her father; Heart disease in her mother; Kidney disease in her maternal grandfather. Social History:  reports that she has never smoked. She has never used smokeless tobacco. She reports current alcohol use. She reports that she does not currently use drugs after having used the following drugs: Marijuana.     Maternal Diabetes: No Genetic Screening: Normal Maternal Ultrasounds/Referrals: Normal Fetal Ultrasounds or other Referrals:  None Maternal Substance Abuse:  No Significant Maternal Medications:  None Significant Maternal Lab Results:  Group B Strep negative Number of Prenatal Visits:greater than 3 verified prenatal visits Other Comments:  None  Review of Systems  Gastrointestinal:  Positive for abdominal pain.  Genitourinary:  Negative for vaginal bleeding.   Maternal Medical History:  Reason for admission: Contractions.   Contractions: Onset was 6-12 hours ago.   Frequency: regular.   Perceived severity is  strong.   Fetal activity: Perceived fetal activity is normal.   Prenatal complications: AMA Prenatal Complications - Diabetes: none.   Dilation: 10 Effacement (%): 100 Station: Plus 2 Exam by:: D.Martin,RN Blood pressure 123/72, pulse 73. Maternal Exam:  Uterine Assessment: Contraction strength is firm.  Contraction frequency is regular.  Abdomen: Patient reports no abdominal tenderness. Fetal presentation: vertex   Physical Exam Cardiovascular:     Rate and Rhythm: Normal rate and regular rhythm.  Pulmonary:     Effort: Pulmonary effort is normal.  Abdominal:     Palpations: Abdomen is soft.  Neurological:     General: No focal deficit present.     Mental Status: She is alert.  Psychiatric:        Mood and Affect: Mood normal.     Prenatal labs: ABO, Rh: --/--/A POS (08/21 8850) Antibody: NEG (08/21 0705) Rubella:  Immune RPR:   NR HBsAg:   Neg HIV:   NR GBS:   Neg One hour GCT 111 NIPT low risk Hgb AA   Assessment/Plan: Pt s/p precipitous vaginal delivery less than 15 min from arrival at hospital.  Apparently delivered past her Word catheter which is still in placed.  After letting her regroup a bit, I will remove the WOrd before she  goes to postpartum floor.     Oliver Pila 01/24/2022, 8:21 AM

## 2022-01-24 NOTE — MAU Note (Signed)
Pt escorted from waiting room to room 129.  Pt denies SROM, +bloody show. Pt reports contractions that began at 0000. She called MD on call. Pt reports contractions are frequent and she has vaginal -rectal pressure.  SVE 8/100/0. LD charge nurse notified of pts arrival and need for bed.

## 2022-01-24 NOTE — Lactation Note (Signed)
This note was copied from a baby's chart. Lactation Consultation Note  Patient Name: Patricia Pennington [id number] Date: 01/24/2022 Reason for consult: Initial assessment;1st time breastfeeding;Primapara;Infant < 6lbs;Early term 37-38.6wks Age:35 hours   P1: Early term infant at 38+1 weeks Feeding preference; Breast/formula  "Chance" was swaddled and asleep when I arrived.  Birth parent stated that he was able to latch and feed after delivery.  It was her decision to ask for formula supplementation.  She desires to supplement to be sure "he is getting enough" until her milk comes to volume.  Discussed the early term infant and his weight < 6 lbs; supported her decision.  Reviewed breast feeding basics.  Encouraged to continue feeding at least every three hours or sooner if "Chance" shows feeding cues.  She is familiar with hand expression.  Suggested hand expression before/after feedings to help ensure a good milk supply.  Offered to return as desired for latch assistance.  Support person and one other visitor present.   Maternal Data Has patient been taught Hand Expression?: Yes Does the patient have breastfeeding experience prior to this delivery?: No  Feeding Mother's Current Feeding Choice: Breast Milk and Formula  LATCH Score Latch: Grasps breast easily, tongue down, lips flanged, rhythmical sucking.  Audible Swallowing: A few with stimulation  Type of Nipple: Everted at rest and after stimulation  Comfort (Breast/Nipple): Soft / non-tender  Hold (Positioning): Assistance needed to correctly position infant at breast and maintain latch.  LATCH Score: 8   Lactation Tools Discussed/Used    Interventions Interventions: Breast feeding basics reviewed;Education;LC Services brochure  Discharge Pump: Personal  Consult Status Consult Status: Follow-up Date: 01/25/22 Follow-up type: In-patient    Zelma Mazariego R Rana Hochstein 01/24/2022, 10:24 AM

## 2022-01-24 NOTE — MAU Note (Signed)
Transport accompanied by Blanche East, NP and this RN.

## 2022-01-24 NOTE — Progress Notes (Signed)
Patient ID: Patricia Pennington, female   DOB: 15-May-1986, 36 y.o.   MRN: [id number] Word catheter deflated and removed without problem.  Pt tender from delivery, but no lacerations seen and no active bleeding

## 2022-01-25 LAB — CBC
HCT: 29.4 % — ABNORMAL LOW (ref 36.0–46.0)
Hemoglobin: 10 g/dL — ABNORMAL LOW (ref 12.0–15.0)
MCH: 30.8 pg (ref 26.0–34.0)
MCHC: 34 g/dL (ref 30.0–36.0)
MCV: 90.5 fL (ref 80.0–100.0)
Platelets: 209 10*3/uL (ref 150–400)
RBC: 3.25 MIL/uL — ABNORMAL LOW (ref 3.87–5.11)
RDW: 13.1 % (ref 11.5–15.5)
WBC: 12.2 10*3/uL — ABNORMAL HIGH (ref 4.0–10.5)
nRBC: 0 % (ref 0.0–0.2)

## 2022-01-25 NOTE — Discharge Summary (Signed)
Postpartum Discharge Summary     Patient Name: Patricia Pennington DOB: November 24, 1985 MRN: [id number]  Date of admission: 01/24/2022 Delivery date:01/24/2022  Delivering provider: Celedonio Savage  Date of discharge: 01/25/2022  Admitting diagnosis: Normal labor [O80, Z37.9] Precipitous delivery, delivered (current hospitalization) [O62.3] Intrauterine pregnancy: [redacted]w[redacted]d     Secondary diagnosis:  Principal Problem:   Normal labor Active Problems:   Precipitous delivery, delivered (current hospitalization)  Additional problems: none    Discharge diagnosis: Term Pregnancy Delivered                                              Post partum procedures: none Augmentation: N/A Complications: None  Hospital course: Onset of Labor With Vaginal Delivery      36 y.o. yo [id number] at [redacted]w[redacted]d was admitted in Active Labor on 01/24/2022. Patient had an uncomplicated labor course as follows:  Membrane Rupture Time/Date: 7:15 AM ,01/24/2022   Delivery Method:Vaginal, Spontaneous  Episiotomy: None  Lacerations:  None  Patient had an uncomplicated postpartum course.  She is ambulating, tolerating a regular diet, passing flatus, and urinating well. Patient is discharged home in stable condition on 01/25/22.  Newborn Data: Birth date:01/24/2022  Birth time:7:16 AM  Gender:Female  Living status:Living  Apgars:9 ,9  Weight:2620 g   Magnesium Sulfate received: No BMZ received: No Rhophylac:N/A Transfusion:No  Physical exam  Vitals:   01/24/22 1434 01/24/22 1734 01/24/22 2036 01/25/22 0614  BP: 117/84 102/85 130/80 123/73  Pulse:  64 69 61  Resp: 16 18 17 17   Temp: 98.5 F (36.9 C) 98.8 F (37.1 C) 98.5 F (36.9 C) 98.3 F (36.8 C)  TempSrc: Oral Oral Oral Oral  SpO2: 100%   100%   General: alert, cooperative, and no distress Lochia: appropriate Uterine Fundus: firm Incision: N/A DVT Evaluation: No evidence of DVT seen on physical exam. Labs: Lab Results  Component Value Date   WBC 12.2 (H)  01/25/2022   HGB 10.0 (L) 01/25/2022   HCT 29.4 (L) 01/25/2022   MCV 90.5 01/25/2022   PLT 209 01/25/2022      Latest Ref Rng & Units 01/15/2013    7:08 PM  CMP  Glucose 70 - 99 mg/dL 89   BUN 6 - 23 mg/dL 11   Creatinine 03/17/2013 - 1.10 mg/dL 2.99   Sodium 3.71 - 696 mEq/L 139   Potassium 3.5 - 5.1 mEq/L 3.7   Chloride 96 - 112 mEq/L 105    Edinburgh Score:    01/24/2022    9:25 AM  Edinburgh Postnatal Depression Scale Screening Tool  I have been able to laugh and see the funny side of things. 0  I have looked forward with enjoyment to things. 0  I have blamed myself unnecessarily when things went wrong. 1  I have been anxious or worried for no good reason. 2  I have felt scared or panicky for no good reason. 1  Things have been getting on top of me. 2  I have been so unhappy that I have had difficulty sleeping. 1  I have felt sad or miserable. 1  I have been so unhappy that I have been crying. 1  The thought of harming myself has occurred to me. 0  Edinburgh Postnatal Depression Scale Total 9      After visit meds:  Allergies as of 01/25/2022   No  Known Allergies      Medication List     STOP taking these medications    cephALEXin 500 MG capsule Commonly known as: KEFLEX         Discharge home in stable condition Infant Feeding:  see peds note Infant Disposition:home with mother Discharge instruction: per After Visit Summary and Postpartum booklet. Activity: Advance as tolerated. Pelvic rest for 6 weeks.  Diet: routine diet Anticipated Birth Control: Unsure Postpartum Appointment:4 weeks Additional Postpartum F/U: Postpartum Depression checkup Future Appointments:No future appointments. Follow up Visit:  Follow-up Information     Huel Cote, MD Follow up in 4 week(s).   Specialty: Obstetrics and Gynecology Contact information: [address] [place] [address] [phone]                     01/25/2022 Philip Aspen, DO

## 2022-01-30 ENCOUNTER — Inpatient Hospital Stay (HOSPITAL_COMMUNITY): Payer: BC Managed Care – PPO

## 2022-01-30 ENCOUNTER — Inpatient Hospital Stay (HOSPITAL_COMMUNITY)
Admission: AD | Admit: 2022-01-30 | Payer: BC Managed Care – PPO | Source: Home / Self Care | Admitting: Obstetrics and Gynecology

## 2022-02-01 ENCOUNTER — Telehealth (HOSPITAL_COMMUNITY): Payer: Self-pay | Admitting: *Deleted

## 2022-02-01 NOTE — Telephone Encounter (Signed)
Patient voiced no questions or concerns regarding her health at this time. EPDS=2. Patient voiced no questions or concerns regarding infant at this time. Patient reports infant sleeps in a bassinet on his back or in the bed with her. RN reviewed ABCs of safe sleep. Patient verbalized understanding. Patient informed about hospital's virtual postpartum classes and support groups. Declined email information at this time. Deforest Hoyles, RN, 02/01/22, [phone]
